# Patient Record
Sex: Female | Born: 1985 | Race: White | Hispanic: No | Marital: Single | Smoking: Never smoker
Health system: Southern US, Community
[De-identification: ages and names within clinical notes are randomized; demographics above are authoritative.]

## PROBLEM LIST (undated history)

## (undated) DIAGNOSIS — J189 Pneumonia, unspecified organism: Secondary | ICD-10-CM

## (undated) DIAGNOSIS — T8859XA Other complications of anesthesia, initial encounter: Secondary | ICD-10-CM

## (undated) DIAGNOSIS — K900 Celiac disease: Secondary | ICD-10-CM

## (undated) DIAGNOSIS — Z22322 Carrier or suspected carrier of Methicillin resistant Staphylococcus aureus: Secondary | ICD-10-CM

## (undated) DIAGNOSIS — F909 Attention-deficit hyperactivity disorder, unspecified type: Secondary | ICD-10-CM

## (undated) DIAGNOSIS — T4145XA Adverse effect of unspecified anesthetic, initial encounter: Secondary | ICD-10-CM

---
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
## 2014-01-18 ENCOUNTER — Encounter (HOSPITAL_COMMUNITY): Payer: Self-pay | Admitting: Emergency Medicine

## 2014-01-18 ENCOUNTER — Inpatient Hospital Stay (HOSPITAL_COMMUNITY)
Admission: EM | Admit: 2014-01-18 | Discharge: 2014-01-23 | DRG: 872 | Disposition: A | Discharge status: Home Health | Payer: Medicaid - Out of State | Attending: Family Medicine | Admitting: Family Medicine

## 2014-01-18 ENCOUNTER — Inpatient Hospital Stay (HOSPITAL_COMMUNITY): Payer: Medicaid - Out of State

## 2014-01-18 DIAGNOSIS — K900 Celiac disease: Secondary | ICD-10-CM | POA: Diagnosis present

## 2014-01-18 DIAGNOSIS — Z823 Family history of stroke: Secondary | ICD-10-CM | POA: Diagnosis not present

## 2014-01-18 DIAGNOSIS — A4102 Sepsis due to Methicillin resistant Staphylococcus aureus: Secondary | ICD-10-CM | POA: Diagnosis present

## 2014-01-18 DIAGNOSIS — L03114 Cellulitis of left upper limb: Secondary | ICD-10-CM

## 2014-01-18 DIAGNOSIS — L0291 Cutaneous abscess, unspecified: Secondary | ICD-10-CM

## 2014-01-18 DIAGNOSIS — N12 Tubulo-interstitial nephritis, not specified as acute or chronic: Secondary | ICD-10-CM | POA: Diagnosis present

## 2014-01-18 DIAGNOSIS — F909 Attention-deficit hyperactivity disorder, unspecified type: Secondary | ICD-10-CM | POA: Diagnosis present

## 2014-01-18 DIAGNOSIS — IMO0002 Reserved for concepts with insufficient information to code with codable children: Secondary | ICD-10-CM | POA: Diagnosis present

## 2014-01-18 DIAGNOSIS — M7989 Other specified soft tissue disorders: Secondary | ICD-10-CM | POA: Diagnosis present

## 2014-01-18 DIAGNOSIS — B379 Candidiasis, unspecified: Secondary | ICD-10-CM | POA: Diagnosis present

## 2014-01-18 DIAGNOSIS — A419 Sepsis, unspecified organism: Secondary | ICD-10-CM | POA: Diagnosis present

## 2014-01-18 DIAGNOSIS — L0390 Cellulitis, unspecified: Secondary | ICD-10-CM | POA: Diagnosis present

## 2014-01-18 DIAGNOSIS — D72829 Elevated white blood cell count, unspecified: Secondary | ICD-10-CM

## 2014-01-18 HISTORY — DX: Other complications of anesthesia, initial encounter: T88.59XA

## 2014-01-18 HISTORY — DX: Attention-deficit hyperactivity disorder, unspecified type: F90.9

## 2014-01-18 HISTORY — DX: Adverse effect of unspecified anesthetic, initial encounter: T41.45XA

## 2014-01-18 HISTORY — DX: Carrier or suspected carrier of methicillin resistant Staphylococcus aureus: Z22.322

## 2014-01-18 HISTORY — DX: Celiac disease: K90.0

## 2014-01-18 LAB — BASIC METABOLIC PANEL
Anion gap: 12 (ref 5–15)
BUN: 9 mg/dL (ref 6–23)
CHLORIDE: 101 meq/L (ref 96–112)
CO2: 27 meq/L (ref 19–32)
Calcium: 9.4 mg/dL (ref 8.4–10.5)
Creatinine, Ser: 0.56 mg/dL (ref 0.50–1.10)
GFR calc non Af Amer: >90 mL/min (ref >90)
Glucose, Bld: 92 mg/dL (ref 70–99)
Potassium: 3.7 mEq/L (ref 3.7–5.3)
Sodium: 140 mEq/L (ref 137–147)

## 2014-01-18 LAB — URINE MICROSCOPIC-ADD ON

## 2014-01-18 LAB — SEDIMENTATION RATE: SED RATE: 19 mm/h (ref 0–22)

## 2014-01-18 LAB — CBC WITH DIFFERENTIAL/PLATELET
BASOS PCT: 0 % (ref 0–1)
Basophils Absolute: 0.1 10*3/uL (ref 0.0–0.1)
Eosinophils Absolute: 0.3 10*3/uL (ref 0.0–0.7)
Eosinophils Relative: 2 % (ref 0–5)
HEMATOCRIT: 35.8 % — AB (ref 36.0–46.0)
Hemoglobin: 11.9 g/dL — ABNORMAL LOW (ref 12.0–15.0)
LYMPHS PCT: 21 % (ref 12–46)
Lymphs Abs: 2.6 10*3/uL (ref 0.7–4.0)
MCH: 31.2 pg (ref 26.0–34.0)
MCHC: 33.2 g/dL (ref 30.0–36.0)
MCV: 94 fL (ref 78.0–100.0)
MONO ABS: 0.6 10*3/uL (ref 0.1–1.0)
Monocytes Relative: 5 % (ref 3–12)
NEUTROS ABS: 9 10*3/uL — AB (ref 1.7–7.7)
NEUTROS PCT: 72 % (ref 43–77)
Platelets: 357 10*3/uL (ref 150–400)
RBC: 3.81 MIL/uL — ABNORMAL LOW (ref 3.87–5.11)
RDW: 12.5 % (ref 11.5–15.5)
WBC: 12.5 10*3/uL — AB (ref 4.0–10.5)

## 2014-01-18 LAB — URINALYSIS, ROUTINE W REFLEX MICROSCOPIC
Bilirubin Urine: NEGATIVE
Glucose, UA: NEGATIVE mg/dL
Hgb urine dipstick: NEGATIVE
Ketones, ur: NEGATIVE mg/dL
NITRITE: NEGATIVE
PH: 6 (ref 5.0–8.0)
Protein, ur: NEGATIVE mg/dL
Urobilinogen, UA: 0.2 mg/dL (ref 0.0–1.0)

## 2014-01-18 LAB — LACTIC ACID, PLASMA: LACTIC ACID, VENOUS: 0.8 mmol/L (ref 0.5–2.2)

## 2014-01-18 MED ORDER — HYDROMORPHONE HCL PF 1 MG/ML IJ SOLN
1.0000 mg | INTRAMUSCULAR | Status: AC | PRN
  Administered 2014-01-18 – 2014-01-19 (×3): 1 mg via INTRAVENOUS
  Filled 2014-01-18 (×3): qty 1

## 2014-01-18 MED ORDER — MORPHINE SULFATE 4 MG/ML IJ SOLN
4.0000 mg | Freq: Once | INTRAMUSCULAR | Status: AC
  Administered 2014-01-18: 4 mg via INTRAVENOUS
  Filled 2014-01-18: qty 1

## 2014-01-18 MED ORDER — ACETAMINOPHEN 325 MG PO TABS
650.0000 mg | ORAL_TABLET | Freq: Four times a day (QID) | ORAL | Status: DC | PRN
  Administered 2014-01-19 – 2014-01-23 (×3): 650 mg via ORAL
  Filled 2014-01-18 (×3): qty 2

## 2014-01-18 MED ORDER — ONDANSETRON HCL 4 MG/2ML IJ SOLN: 4.0000 mg | Freq: Three times a day (TID) | INTRAMUSCULAR | Status: DC | PRN

## 2014-01-18 MED ORDER — ENOXAPARIN SODIUM 40 MG/0.4ML ~~LOC~~ SOLN
40.0000 mg | SUBCUTANEOUS | Status: DC
  Administered 2014-01-18 – 2014-01-19 (×2): 40 mg via SUBCUTANEOUS
  Filled 2014-01-18 (×2): qty 0.4

## 2014-01-18 MED ORDER — AMPHETAMINE-DEXTROAMPHETAMINE 10 MG PO TABS
30.0000 mg | ORAL_TABLET | Freq: Every day | ORAL | Status: DC
  Filled 2014-01-18: qty 3

## 2014-01-18 MED ORDER — DIPHENHYDRAMINE HCL 50 MG/ML IJ SOLN
25.0000 mg | Freq: Two times a day (BID) | INTRAMUSCULAR | Status: DC
  Administered 2014-01-19 – 2014-01-22 (×7): 25 mg via INTRAVENOUS
  Filled 2014-01-18 (×7): qty 1

## 2014-01-18 MED ORDER — ONDANSETRON HCL 4 MG PO TABS
4.0000 mg | ORAL_TABLET | Freq: Four times a day (QID) | ORAL | Status: DC | PRN
  Administered 2014-01-22: 4 mg via ORAL
  Filled 2014-01-18: qty 1

## 2014-01-18 MED ORDER — OXYCODONE-ACETAMINOPHEN 5-325 MG PO TABS
1.0000 | ORAL_TABLET | ORAL | Status: DC | PRN
  Administered 2014-01-18: 1 via ORAL
  Administered 2014-01-19 – 2014-01-22 (×9): 2 via ORAL
  Filled 2014-01-18 (×4): qty 2
  Filled 2014-01-18: qty 1
  Filled 2014-01-18 (×5): qty 2

## 2014-01-18 MED ORDER — ONDANSETRON HCL 4 MG/2ML IJ SOLN
4.0000 mg | Freq: Once | INTRAMUSCULAR | Status: AC
  Administered 2014-01-18: 4 mg via INTRAVENOUS
  Filled 2014-01-18: qty 2

## 2014-01-18 MED ORDER — SODIUM CHLORIDE 0.9 % IV SOLN
INTRAVENOUS | Status: AC
  Administered 2014-01-18: 19:00:00 via INTRAVENOUS

## 2014-01-18 MED ORDER — LIDOCAINE HCL (PF) 2 % IJ SOLN
2.0000 mL | Freq: Once | INTRAMUSCULAR | Status: AC
  Administered 2014-01-18: 2 mL
  Filled 2014-01-18: qty 10

## 2014-01-18 MED ORDER — AMPHETAMINE-DEXTROAMPHETAMINE 10 MG PO TABS
10.0000 mg | ORAL_TABLET | Freq: Every day | ORAL | Status: DC
  Administered 2014-01-19 – 2014-01-22 (×3): 10 mg via ORAL
  Filled 2014-01-18 (×3): qty 1

## 2014-01-18 MED ORDER — VANCOMYCIN HCL IN DEXTROSE 1-5 GM/200ML-% IV SOLN
INTRAVENOUS | Status: AC
  Filled 2014-01-18: qty 400

## 2014-01-18 MED ORDER — CEFTRIAXONE SODIUM 1 G IJ SOLR
INTRAMUSCULAR | Status: AC
  Filled 2014-01-18: qty 10

## 2014-01-18 MED ORDER — DEXTROSE 5 % IV SOLN
1.0000 g | INTRAVENOUS | Status: DC
  Filled 2014-01-18 (×3): qty 10

## 2014-01-18 MED ORDER — VANCOMYCIN HCL IN DEXTROSE 1-5 GM/200ML-% IV SOLN
1000.0000 mg | Freq: Two times a day (BID) | INTRAVENOUS | Status: DC
  Administered 2014-01-19 – 2014-01-22 (×7): 1000 mg via INTRAVENOUS
  Filled 2014-01-18 (×9): qty 200

## 2014-01-18 MED ORDER — ONDANSETRON HCL 4 MG/2ML IJ SOLN
4.0000 mg | Freq: Four times a day (QID) | INTRAMUSCULAR | Status: DC | PRN
Start: 2014-01-18 — End: 2014-01-23
  Administered 2014-01-18 – 2014-01-22 (×10): 4 mg via INTRAVENOUS
  Filled 2014-01-18 (×12): qty 2

## 2014-01-18 MED ORDER — DIPHENHYDRAMINE HCL 50 MG/ML IJ SOLN
25.0000 mg | Freq: Once | INTRAMUSCULAR | Status: AC
  Administered 2014-01-18: 25 mg via INTRAVENOUS
  Filled 2014-01-18: qty 1

## 2014-01-18 MED ORDER — DOCUSATE SODIUM 100 MG PO CAPS
100.0000 mg | ORAL_CAPSULE | Freq: Two times a day (BID) | ORAL | Status: DC
  Administered 2014-01-18 – 2014-01-23 (×4): 100 mg via ORAL
  Filled 2014-01-18 (×9): qty 1

## 2014-01-18 MED ORDER — VANCOMYCIN HCL IN DEXTROSE 1-5 GM/200ML-% IV SOLN
1000.0000 mg | Freq: Once | INTRAVENOUS | Status: AC
  Administered 2014-01-18: 1000 mg via INTRAVENOUS
  Filled 2014-01-18: qty 200

## 2014-01-18 MED ORDER — ACETAMINOPHEN 650 MG RE SUPP: 650.0000 mg | Freq: Four times a day (QID) | RECTAL | Status: DC | PRN

## 2014-01-18 MED ORDER — DEXTROSE 5 % IV SOLN
1.0000 g | INTRAVENOUS | Status: DC
  Administered 2014-01-18 – 2014-01-19 (×2): 1 g via INTRAVENOUS
  Filled 2014-01-18 (×3): qty 10

## 2014-01-18 NOTE — H&P (Signed)
PCP: [facility]   Chief Complaint:  Left elbow swelling HPI: [name] [name] is a 28 y.o. female   has a past medical history of ADHD (attention deficit hyperactivity disorder); MRSA (methicillin resistant staph aureus) culture positive; and Celiac disease.   Presented with  1 week hx of left elbow swelling and pain. Started with a small bump and progressed. Her forearm started to hurt she went to [facility] 2 days ago. And was put on doxycyline but did not improve. She has  Had MRSA in the past. % years ago she had a large thigh abscess. She reports some chills and slightly elevated BP.She is able to move the elbow but it is limited by pain. Some nausea no vomiting. She has had some back pain as well. In ER evidence of UTI and leukocytosis. In ER she had an I &D done and wound culture were sent. Plain films unremarkable no evidence of gas in tissues. Patient noted that she had had generalized painful lymphadenopathy few months ago.  Hospitalist was called for admission for cellulitis and pyelonephritis.   Review of Systems:    Pertinent positives include: chills, left arm swelling, back pain  Constitutional:  No weight loss, night sweats, Fevers,  fatigue, weight loss  HEENT:  No headaches, Difficulty swallowing,Tooth/dental problems,Sore throat,  No sneezing, itching, ear ache, nasal congestion, post nasal drip,  Cardio-vascular:  No chest pain, Orthopnea, PND, anasarca, dizziness, palpitations.no Bilateral lower extremity swelling  GI:  No heartburn, indigestion, abdominal pain, nausea, vomiting, diarrhea, change in bowel habits, loss of appetite, melena, blood in stool, hematemesis Resp:  no shortness of breath at rest. No dyspnea on exertion, No excess mucus, no productive cough, No non-productive cough, No coughing up of blood.No change in color of mucus.No wheezing. Skin:  no rash or lesions. No jaundice GU:  no dysuria, change in color of urine, no urgency or frequency. No  straining to urinate.  No flank pain.  Musculoskeletal:  No joint pain or no joint swelling. No decreased range of motion. No back pain.  Psych:  No change in mood or affect. No depression or anxiety. No memory loss.  Neuro: no localizing neurological complaints, no tingling, no weakness, no double vision, no gait abnormality, no slurred speech, no confusion  Otherwise ROS are negative except for above, 10 systems were reviewed  Past Medical History: Past Medical History  Diagnosis Date  . ADHD (attention deficit hyperactivity disorder)   . MRSA (methicillin resistant staph aureus) culture positive   . Celiac disease    Past Surgical History  Procedure Laterality Date  . Tonsillectomy       Medications: Prior to Admission medications   Medication Sig Start Date End Date Taking? Authorizing Provider  amphetamine-dextroamphetamine (ADDERALL) 10 MG tablet Take 10 mg by mouth daily at 12 noon. In afternoon   Yes Historical Provider, MD  amphetamine-dextroamphetamine (ADDERALL) 30 MG tablet Take 30 mg by mouth daily. In AM   Yes Historical Provider, MD  doxycycline (VIBRA-TABS) 100 MG tablet Take 100 mg by mouth 2 (two) times daily.   Yes Historical Provider, MD  HYDROcodone-acetaminophen (NORCO/VICODIN) 5-325 MG per tablet Take 1 tablet by mouth every 6 (six) hours as needed (pain).   Yes Historical Provider, MD    Allergies:   Allergies  Allergen Reactions  . Latex Hives  . Pineapple Hives    Social History:  Ambulatory  independently   Lives at home With family     reports that she has never  smoked. She does not have any smokeless tobacco history on file. She reports that she does not drink alcohol or use illicit drugs.    Family History: family history includes Cancer - Other in her father; Stroke in her mother.    Physical Exam: Patient Vitals for the past 24 hrs:  BP Temp Temp src Pulse Resp SpO2 Height Weight  01/18/14 1527 - - - 100 19 100 % - -  01/18/14  1349 144/95 mmHg 99 F (37.2 C) Oral 116 18 100 %  (1.6 m) 62.596 kg (138 lb)    1. General:  in No Acute distress 2. Psychological: Alert and   Oriented 3. Head/ENT:   Moist   Mucous Membranes                          Head Non traumatic, neck supple                          Normal   Dentition 4. SKIN: normal   Skin turgor,  Skin clean erythema noted around left elbow, small area of incision.  5. Heart: Regular rate and rhythm no Murmur, Rub or gallop 6. Lungs: Clear to auscultation bilaterally, no wheezes or crackles   7. Abdomen: Soft, non-tender, Non distended 8. Lower extremities: no clubbing, cyanosis, or edema 9. Neurologically Grossly intact, moving all 4 extremities equally 10. MSK: Normal range of motion costovertebral tenderness noted on the right  body mass index is 24.45 kg/(m^2).   Labs on Admission:   Recent Labs  01/18/14 1446  NA 140  K 3.7  CL 101  CO2 27  GLUCOSE 92  BUN 9  CREATININE 0.56  CALCIUM 9.4   No results found for this basename: AST, ALT, ALKPHOS, BILITOT, PROT, ALBUMIN,  in the last 72 hours No results found for this basename: LIPASE, AMYLASE,  in the last 72 hours  Recent Labs  01/18/14 1446  WBC 12.5*  NEUTROABS 9.0*  HGB 11.9*  HCT 35.8*  MCV 94.0  PLT 357   No results found for this basename: CKTOTAL, CKMB, CKMBINDEX, TROPONINI,  in the last 72 hours No results found for this basename: TSH, T4TOTAL, FREET3, T3FREE, THYROIDAB,  in the last 72 hours No results found for this basename: VITAMINB12, FOLATE, FERRITIN, TIBC, IRON, RETICCTPCT,  in the last 72 hours No results found for this basename: HGBA1C    Estimated Creatinine Clearance: 85.8 ml/min (by C-G formula based on Cr of 0.56). ABG No results found for this basename: phart, pco2, po2, hco3, tco2, acidbasedef, o2sat     No results found for this basename: DDIMER     UA evidence of UTI  BNP (last 3 results) No results found for this basename: PROBNP,  in the  last 8760 hours  Filed Weights   01/18/14 1349  Weight: 62.596 kg (138 lb)   Cultures: No results found for this basename: sdes, specrequest, cult, reptstatus     Radiological Exams on Admission: No results found.  Chart has been reviewed  Assessment/Plan 28 year old female with history of celiac disease here with cellulitis of the left elbow and pyelonephritis worrisome for early sepsis  Present on Admission:  . Cellulitis - history of MRSA in the past. Will cover with vancomycin. Plain imaging negative for gas in the soft tissues. Obtain lactic acid and sedimentation rate. No evidence of osteomyelitis. Given her recurrent infections and history of lymphadenopathy and  will  check HIV status Pyelonephritis - treat with IV Rocephin await results of urine culture, obtain renal ultrasound Early sepsis given elevated white blood cell count and heart rate. Will give IV fluids and covered by the antibiotics. Prophylaxis:   Lovenox,  CODE STATUS:  FULL CODE   Other plan as per orders.  I have spent a total of 55 min on this admission  Arran Fessel 01/18/2014, 4:50 PM  [facility]  Pager [phone]   If 7AM-7PM, please contact the day team taking care of the patient  [email or web address]  Password [email or web address]

## 2014-01-18 NOTE — ED Notes (Signed)
Pt states she has a swollen area close to her lt elbow, unsure of how it started, may have started from a spider bite. Area swollen and red with a white center. Placed on Doxycycline on Wednesday, swelling and pain worse.

## 2014-01-18 NOTE — ED Notes (Signed)
Left forearm to left upper arm swollen, red, tender to touch. Warm, sensory intact.

## 2014-01-18 NOTE — ED Notes (Signed)
Report given to [facility] on Dept 300, all questions answered.

## 2014-01-18 NOTE — Progress Notes (Signed)
ANTIBIOTIC CONSULT NOTE - INITIAL  Pharmacy Consult for Vancomycin Indication: cellulitis  Allergies  Allergen Reactions  . Latex Hives  . Pineapple Hives  . Aloe Vera Hives    Can use lotions with Aloe in it but allergic to Aloe plant.     Patient Measurements: Height:  (160 cm) Weight: 138 lb (62.596 kg) IBW/kg (Calculated) : 52.4  Vital Signs: Temp: 98.7 F (37.1 C) (09/11 1828) Temp src: Oral (09/11 1828) BP: 124/90 mmHg (09/11 1828) Pulse Rate: 91 (09/11 1828) Intake/Output from previous day:   Intake/Output from this shift:    Labs:  Recent Labs  01/18/14 1446  WBC 12.5*  HGB 11.9*  PLT 357  CREATININE 0.56   Estimated Creatinine Clearance: 85.8 ml/min (by C-G formula based on Cr of 0.56). No results found for this basename: VANCOTROUGH, VANCOPEAK, VANCORANDOM, GENTTROUGH, GENTPEAK, GENTRANDOM, TOBRATROUGH, TOBRAPEAK, TOBRARND, AMIKACINPEAK, AMIKACINTROU, AMIKACIN,  in the last 72 hours   Microbiology: No results found for this or any previous visit (from the past 720 hour(s)).  Medical History: Past Medical History  Diagnosis Date  . ADHD (attention deficit hyperactivity disorder)   . MRSA (methicillin resistant staph aureus) culture positive   . Celiac disease    Anti-infectives   Start     Dose/Rate Route Frequency Ordered Stop   01/19/14 0300  vancomycin (VANCOCIN) IVPB 1000 mg/200 mL premix     1,000 mg 200 mL/hr over 60 Minutes Intravenous Every 12 hours 01/18/14 1839     01/18/14 1830  cefTRIAXone (ROCEPHIN) 1 g in dextrose 5 % 50 mL IVPB     1 g 100 mL/hr over 30 Minutes Intravenous Every 24 hours 01/18/14 1813     01/18/14 1500  vancomycin (VANCOCIN) IVPB 1000 mg/200 mL premix     1,000 mg 200 mL/hr over 60 Minutes Intravenous  Once 01/18/14 1445 01/18/14 1646     Assessment: Good renal function  Goal of Therapy:  Trough level 12-18  Plan:  Vancomycin 1GM IV q12hrs Check trough at steady [address] [place]  A 01/18/2014,6:44 PM

## 2014-01-18 NOTE — ED Notes (Signed)
Patient moved to room 20, for spo2 and cardiac monitoring

## 2014-01-18 NOTE — ED Provider Notes (Signed)
CSN: [id number]     Arrival date & time 01/18/14  1346 History   First MD Initiated Contact with Patient 01/18/14 1413     Chief Complaint  Patient presents with  . Abscess     (Consider location/radiation/quality/duration/timing/severity/associated sxs/prior Treatment) HPI  [name] [name] is a 28 y.o. female with distant history of mrsa,  Presenting with a 5 day history of increasing pain,  Swelling and redness of her left forearm, started as a small pimple at her left posterior elbow.  She was seen at an outside hospital 2 days ago and placed on doxycycline.  Her pain is worsened, she has spreading redness and swelling despite antibiotics and warm compresses.  She has has had 4 doses of doxycycline, last dose taken this morning.  She denies documented fevers, but has felt warm with intermittent chills.  She has had no nausea, vomiting or other complaint.    Past Medical History  Diagnosis Date  . ADHD (attention deficit hyperactivity disorder)   . MRSA (methicillin resistant staph aureus) culture positive   . Celiac disease    Past Surgical History  Procedure Laterality Date  . Tonsillectomy     History reviewed. No pertinent family history. History  Substance Use Topics  . Smoking status: Never Smoker   . Smokeless tobacco: Not on file  . Alcohol Use: No   OB History   Grav Para Term Preterm Abortions TAB SAB Ect Mult Living                 Review of Systems  Constitutional: Positive for chills. Negative for fever.  HENT: Negative.   Eyes: Negative.   Respiratory: Negative.   Cardiovascular: Negative.   Gastrointestinal: Negative.  Negative for nausea and vomiting.  Genitourinary: Negative.   Musculoskeletal: Positive for arthralgias. Negative for joint swelling and myalgias.  Skin: Positive for color change and wound.  Neurological: Negative for weakness and numbness.      Allergies  Latex and Pineapple  Home Medications   Prior to Admission medications    Medication Sig Start Date End Date Taking? Authorizing Provider  amphetamine-dextroamphetamine (ADDERALL) 10 MG tablet Take 10 mg by mouth daily at 12 noon.   Yes Historical Provider, MD  amphetamine-dextroamphetamine (ADDERALL) 30 MG tablet Take 30 mg by mouth daily.   Yes Historical Provider, MD  doxycycline (VIBRA-TABS) 100 MG tablet Take 100 mg by mouth 2 (two) times daily.   Yes Historical Provider, MD  HYDROcodone-acetaminophen (NORCO/VICODIN) 5-325 MG per tablet Take 1 tablet by mouth every 6 (six) hours as needed (pain).   Yes Historical Provider, MD   BP 144/95  Pulse 100  Temp(Src) 99 F (37.2 C) (Oral)  Resp 19  Ht  (1.6 m)  Wt 138 lb (62.596 kg)  BMI 24.45 kg/m2  SpO2 100%  LMP 01/04/2014 Physical Exam  Nursing note and vitals reviewed. Constitutional: She appears well-developed and well-nourished.  HENT:  Head: Normocephalic and atraumatic.  Eyes: Conjunctivae are normal.  Neck: Neck supple.  Cardiovascular: Regular rhythm, normal heart sounds and intact distal pulses.   tachycardic  Pulmonary/Chest: Effort normal and breath sounds normal.  Musculoskeletal: She exhibits edema and tenderness.  Neurological: She is alert.  Skin: Skin is warm and dry. There is erythema.  Small pustule posterior left upper forearm.  Cellulitis and edema of forearm to just above wrist, mostly dorsal.  Erythema without edema proximal to volar mid upper arm.  No red streaking, no axillary adenopathy.   Psychiatric: She has  a normal mood and affect.    ED Course  Procedures (including critical care time)   INCISION AND DRAINAGE Performed by: Breven Guidroz ConsBurgess Amorbal consent obtained. Risks and benefits: risks, benefits and alternatives were discussed Type: abscess  Body area: left elbow  Anesthesia: local infiltration  Incision was made with a scalpel.  Local anesthetic: lidocaine 2% without epinephrine  Anesthetic total: 1 ml  Complexity: complex Blunt dissection  to break up loculations  Drainage: purulent  Drainage amount: trace  Packing material: na. Small pustule only. Patient tolerance: Patient tolerated the procedure well with no immediate complications.    Labs Review Labs Reviewed  CBC WITH DIFFERENTIAL - Abnormal; Notable for the following:    WBC 12.5 (*)    RBC 3.81 (*)    Hemoglobin 11.9 (*)    HCT 35.8 (*)    Neutro Abs 9.0 (*)    All other components within normal limits  URINALYSIS, ROUTINE W REFLEX MICROSCOPIC - Abnormal; Notable for the following:    Specific Gravity, Urine <1.005 (*)    Leukocytes, UA LARGE (*)    All other components within normal limits  URINE MICROSCOPIC-ADD ON - Abnormal; Notable for the following:    Squamous Epithelial / LPF MANY (*)    Bacteria, UA MANY (*)    All other components within normal limits  CULTURE, ROUTINE-ABSCESS  BASIC METABOLIC PANEL  POC URINE PREG, ED    Imaging Review No results found.   EKG Interpretation None      MDM   Final diagnoses:  Cellulitis of left upper extremity    Pt was seen by Dr Estell Harpin who agrees with admission.  Labs reviewed.  Pt started on IV vancomycin.   Pustule opened,  Culture collected.  Spoke with Dr Adela Glimpse with [facility] - agrees with admission.  Temp admit orders placed.    Burgess Amor, PA-C 01/18/14 1640

## 2014-01-18 NOTE — ED Notes (Signed)
About 5 mins after starting Vancomycin patient became very red in face and trunk, patient complaining of severe itching. Vancomycin stopped and Burgess Amor informed.

## 2014-01-19 ENCOUNTER — Inpatient Hospital Stay (HOSPITAL_COMMUNITY): Payer: Medicaid - Out of State

## 2014-01-19 DIAGNOSIS — D72829 Elevated white blood cell count, unspecified: Secondary | ICD-10-CM

## 2014-01-19 DIAGNOSIS — A419 Sepsis, unspecified organism: Secondary | ICD-10-CM

## 2014-01-19 LAB — CBC
HCT: 35.3 % — ABNORMAL LOW (ref 36.0–46.0)
Hemoglobin: 11.4 g/dL — ABNORMAL LOW (ref 12.0–15.0)
MCH: 31.1 pg (ref 26.0–34.0)
MCHC: 32.3 g/dL (ref 30.0–36.0)
MCV: 96.2 fL (ref 78.0–100.0)
Platelets: 288 10*3/uL (ref 150–400)
RBC: 3.67 MIL/uL — AB (ref 3.87–5.11)
RDW: 12.8 % (ref 11.5–15.5)
WBC: 10.5 10*3/uL (ref 4.0–10.5)

## 2014-01-19 LAB — COMPREHENSIVE METABOLIC PANEL
ALT: 10 U/L (ref 0–35)
AST: 12 U/L (ref 0–37)
Albumin: 3.3 g/dL — ABNORMAL LOW (ref 3.5–5.2)
Alkaline Phosphatase: 46 U/L (ref 39–117)
Anion gap: 12 (ref 5–15)
BUN: 11 mg/dL (ref 6–23)
CALCIUM: 8.5 mg/dL (ref 8.4–10.5)
CHLORIDE: 100 meq/L (ref 96–112)
CO2: 26 meq/L (ref 19–32)
Creatinine, Ser: 0.56 mg/dL (ref 0.50–1.10)
GLUCOSE: 149 mg/dL — AB (ref 70–99)
Potassium: 3.6 mEq/L — ABNORMAL LOW (ref 3.7–5.3)
SODIUM: 138 meq/L (ref 137–147)
Total Bilirubin: 0.2 mg/dL — ABNORMAL LOW (ref 0.3–1.2)
Total Protein: 6.1 g/dL (ref 6.0–8.3)

## 2014-01-19 LAB — HIV ANTIBODY (ROUTINE TESTING W REFLEX): HIV 1&2 Ab, 4th Generation: NONREACTIVE

## 2014-01-19 LAB — MAGNESIUM: Magnesium: 2 mg/dL (ref 1.5–2.5)

## 2014-01-19 LAB — PHOSPHORUS: Phosphorus: 3.8 mg/dL (ref 2.3–4.6)

## 2014-01-19 LAB — MRSA PCR SCREENING: MRSA by PCR: NEGATIVE

## 2014-01-19 MED ORDER — AMPHETAMINE-DEXTROAMPHETAMINE 10 MG PO TABS
30.0000 mg | ORAL_TABLET | Freq: Every day | ORAL | Status: DC
  Administered 2014-01-19 – 2014-01-23 (×5): 30 mg via ORAL
  Filled 2014-01-19 (×5): qty 3

## 2014-01-19 MED ORDER — TEMAZEPAM 15 MG PO CAPS
15.0000 mg | ORAL_CAPSULE | Freq: Every evening | ORAL | Status: DC | PRN
  Administered 2014-01-19 – 2014-01-22 (×4): 15 mg via ORAL
  Filled 2014-01-19 (×4): qty 1

## 2014-01-19 MED ORDER — MORPHINE SULFATE 2 MG/ML IJ SOLN
2.0000 mg | INTRAMUSCULAR | Status: DC | PRN
  Administered 2014-01-19 – 2014-01-21 (×9): 2 mg via INTRAVENOUS
  Filled 2014-01-19 (×9): qty 1

## 2014-01-19 MED ORDER — SODIUM CHLORIDE 0.9 % IV SOLN
INTRAVENOUS | Status: AC
  Administered 2014-01-19: 12:00:00 via INTRAVENOUS

## 2014-01-19 MED ORDER — BACITRACIN ZINC 500 UNIT/GM EX OINT
TOPICAL_OINTMENT | Freq: Two times a day (BID) | CUTANEOUS | Status: DC
  Administered 2014-01-19 – 2014-01-23 (×7): 1 via TOPICAL
  Filled 2014-01-19 (×8): qty 0.9

## 2014-01-19 NOTE — Progress Notes (Signed)
Pt. Complaining of 8/10 pain that is unrelieved by Percocet.  Notified MD. Will continue to monitor patient and follow MD's orders.

## 2014-01-19 NOTE — Progress Notes (Signed)
Utilization review Completed Camira Geidel RN BSN   

## 2014-01-19 NOTE — ED Provider Notes (Addendum)
Medical screening examination/treatment/procedure(s) were conducted as a shared visit with non-physician practitioner(s) and myself.  I personally evaluated the patient during the encounter.   EKG Interpretation None      Pt complains of pain  And swelling left forearm.  pe left forearm with abscess,  Tender and swollen and inflamed  Benny Lennert, MD 01/19/14 0022  Benny Lennert, MD 01/28/14 [phone]

## 2014-01-19 NOTE — Progress Notes (Signed)
[facility] PROGRESS NOTE  [name] [name] ONG:[id number] DOB: 12-02-1985 DOA: 01/18/2014 PCP: PROVIDER NOT IN SYSTEM  Assessment/Plan: 1-SIRS/early sepsis due to left elbow cellulitis: -continue vancomycin -start cold compresses and arm elevation -erythema is improving; but still swollen and with pain -WBC's trending down and no fever. -if failed to improved further will get MRI to assess tissue extension and discussed with ortho for deeper I&D  2-hx of MRSA infection: patient broken skin in her back, from what appears to be 2 pimples will apply bacitracin and vanc will cover. Will also check for MRSA screening again; if positive will follow decolonization protocol  3-UTI continue rocephin. Follow urine cultures. No fever, WBC's trending down. Positive CVA on exam  4-ADHD: continue adderall  5-leukocytosis: due to #1. Improving -continue IV antibiotics.   Code Status: Full Family Communication: mother at bedside  Disposition Plan: home when infection/symptoms controlled   Consultants:  None   Procedures:  Left elbow bedside I&D (9/11)  Antibiotics:  Vanc  Rocephin    HPI/Subjective: Afebrile, denies nausea or vomiting. Feeling slightly better; but still with pain in her left elbow and with some swelling/erythematous changes. Positive CVA; no dysuria.  Objective: Filed Vitals:   01/19/14 0932  BP: 102/54  Pulse: 84  Temp:   Resp:     Intake/Output Summary (Last 24 hours) at 01/19/14 1127 Last data filed at 01/19/14 0939  Gross per 24 hour  Intake   1630 ml  Output   1300 ml  Net    330 ml   Filed Weights   01/18/14 1349 01/18/14 1828  Weight: 62.596 kg (138 lb) 63.549 kg (140 lb 1.6 oz)    Exam:   General:  Afebrile, feeling better; still with pain in her left elbow  Cardiovascular: S1 and s2, no rubs or gallops  Respiratory: CTA bilaterally  Abdomen: soft, NT, ND, positive BS; positive CVA  Musculoskeletal: left elbow swollen, with  erythema and mild induration feeling on exam; slight sanguineous suppuration/drainage)  Data Reviewed: Basic Metabolic Panel:  Recent Labs Lab 01/18/14 1446 01/19/14 0538  NA 140 138  K 3.7 3.6*  CL 101 100  CO2 27 26  GLUCOSE 92 149*  BUN 9 11  CREATININE 0.56 0.56  CALCIUM 9.4 8.5  MG  --  2.0  PHOS  --  3.8   Liver Function Tests:  Recent Labs Lab 01/19/14 0538  AST 12  ALT 10  ALKPHOS 46  BILITOT 0.2*  PROT 6.1  ALBUMIN 3.3*   CBC:  Recent Labs Lab 01/18/14 1446 01/19/14 0538  WBC 12.5* 10.5  NEUTROABS 9.0*  --   HGB 11.9* 11.4*  HCT 35.8* 35.3*  MCV 94.0 96.2  PLT 357 288    Recent Results (from the past 240 hour(s))  CULTURE, ROUTINE-ABSCESS     Status: None   Collection Time    01/18/14  4:16 PM      Result Value Ref Range Status   Specimen Description OTHER   Final   Special Requests Normal   Final   Gram Stain     Final   Value: NO WBC SEEN     NO SQUAMOUS EPITHELIAL CELLS SEEN     NO ORGANISMS SEEN     Performed at [facility]   Culture     Final   Value: NO GROWTH     Performed at [facility]   Report Status PENDING   Incomplete     Studies: Dg Elbow Complete Left  01/18/2014   CLINICAL DATA:  Pain and wound overlying the olecranon  EXAM: LEFT ELBOW - COMPLETE 3+ VIEW  COMPARISON:  Concurrently obtained radiographs of the forearm  FINDINGS: There is no evidence of fracture, dislocation, or joint effusion. There is no evidence of arthropathy or other focal bone abnormality. Soft tissues are unremarkable.  IMPRESSION: Negative.   Electronically Signed   By: Malachy Moan M.D.   On: 01/18/2014 17:32   Dg Forearm Left  01/18/2014   CLINICAL DATA:  Pain and swelling distal to the olecranon  EXAM: LEFT FOREARM - 2 VIEW  COMPARISON:  Concurrently obtained radiographs of the elbow  FINDINGS: No acute fracture or malalignment. Normal bony mineralization without lytic or blastic osseous lesion. No subcutaneous emphysema or  retained radiopaque foreign body. There is soft tissue swelling over the dorsal aspect of the proximal and mid forearm.  IMPRESSION: Focal soft tissue swelling over the dorsal aspect of the proximal and mid forearm without evidence of retained radiopaque foreign body, subcutaneous emphysema or acute osseous abnormality.   Electronically Signed   By: Malachy Moan M.D.   On: 01/18/2014 17:33    Scheduled Meds: . amphetamine-dextroamphetamine  10 mg Oral Q1200  . amphetamine-dextroamphetamine  30 mg Oral Q breakfast  . cefTRIAXone (ROCEPHIN) IVPB 1 gram/50 mL D5W  1 g Intravenous Q24H  . diphenhydrAMINE  25 mg Intravenous Q12H  . docusate sodium  100 mg Oral BID  . enoxaparin (LOVENOX) injection  40 mg Subcutaneous Q24H  . vancomycin  1,000 mg Intravenous Q12H   Continuous Infusions: . sodium chloride      Active Problems:   Cellulitis   Sepsis    Time spent: >30 minutes    Vassie Loll  [facility] Pager [phone]. If 7PM-7AM, please contact night-coverage at [email or web address], password [email or web address] 01/19/2014, 11:27 AM  LOS: 1 day

## 2014-01-20 ENCOUNTER — Encounter (HOSPITAL_COMMUNITY): Payer: Self-pay | Admitting: Radiology

## 2014-01-20 ENCOUNTER — Inpatient Hospital Stay (HOSPITAL_COMMUNITY): Payer: Medicaid - Out of State

## 2014-01-20 LAB — URINE CULTURE
Colony Count: NO GROWTH
Culture: NO GROWTH
Special Requests: NORMAL

## 2014-01-20 MED ORDER — PIPERACILLIN-TAZOBACTAM 3.375 G IVPB
3.3750 g | Freq: Three times a day (TID) | INTRAVENOUS | Status: DC
  Administered 2014-01-20 – 2014-01-22 (×6): 3.375 g via INTRAVENOUS
  Filled 2014-01-20 (×9): qty 50

## 2014-01-20 MED ORDER — ONDANSETRON HCL 4 MG/2ML IJ SOLN
4.0000 mg | Freq: Once | INTRAMUSCULAR | Status: AC
  Administered 2014-01-20: 4 mg via INTRAVENOUS

## 2014-01-20 MED ORDER — CHLORHEXIDINE GLUCONATE 4 % EX LIQD
60.0000 mL | Freq: Once | CUTANEOUS | Status: AC
  Administered 2014-01-20: 4 via TOPICAL
  Filled 2014-01-20: qty 15

## 2014-01-20 MED ORDER — SODIUM CHLORIDE 0.9 % IV SOLN
INTRAVENOUS | Status: AC
  Administered 2014-01-20: 16:00:00 via INTRAVENOUS

## 2014-01-20 NOTE — Progress Notes (Signed)
ANTIBIOTIC CONSULT NOTE -   Pharmacy Consult for Vancomycin and Zosyn Indication: cellulitis  Allergies  Allergen Reactions  . Latex Hives  . Pineapple Hives  . Aloe Vera Hives    Can use lotions with Aloe in it but allergic to Aloe plant.    Patient Measurements: Height:  (160 cm) Weight: 140 lb 1.6 oz (63.549 kg) IBW/kg (Calculated) : 52.4  Vital Signs: Temp: 98 F (36.7 C) (09/13 0609) Temp src: Oral (09/13 0609) BP: 97/52 mmHg (09/13 0609) Pulse Rate: 76 (09/13 0609) Intake/Output from previous day: 09/12 0701 - 09/13 0700 In: 1838.8 [P.O.:1320; I.V.:318.8; IV Piggyback:200] Out: 4450 [Urine:4450] Intake/Output from this shift: Total I/O In: 120 [P.O.:120] Out: 1700 [Urine:1700]  Labs:  Recent Labs  01/18/14 1446 01/19/14 0538  WBC 12.5* 10.5  HGB 11.9* 11.4*  PLT 357 288  CREATININE 0.56 0.56   Estimated Creatinine Clearance: 93 ml/min (by C-G formula based on Cr of 0.56). No results found for this basename: VANCOTROUGH, Leodis Binet, VANCORANDOM, GENTTROUGH, GENTPEAK, GENTRANDOM, TOBRATROUGH, TOBRAPEAK, TOBRARND, AMIKACINPEAK, AMIKACINTROU, AMIKACIN,  in the last 72 hours   Microbiology: Recent Results (from the past 720 hour(s))  CULTURE, ROUTINE-ABSCESS     Status: None   Collection Time    01/18/14  4:16 PM      Result Value Ref Range Status   Specimen Description OTHER   Final   Special Requests Normal   Final   Gram Stain     Final   Value: NO WBC SEEN     NO SQUAMOUS EPITHELIAL CELLS SEEN     NO ORGANISMS SEEN     Performed at [facility]   Culture     Final   Value: FEW STAPHYLOCOCCUS AUREUS     Note: RIFAMPIN AND GENTAMICIN SHOULD NOT BE USED AS SINGLE DRUGS FOR TREATMENT OF STAPH INFECTIONS.     Performed at [facility]   Report Status PENDING   Incomplete  MRSA PCR SCREENING     Status: None   Collection Time    01/19/14 12:53 PM      Result Value Ref Range Status   MRSA by PCR NEGATIVE  NEGATIVE Final   Comment:             The GeneXpert MRSA Assay (FDA     approved for NASAL specimens     only), is one component of a     comprehensive MRSA colonization     surveillance program. It is not     intended to diagnose MRSA     infection nor to guide or     monitor treatment for     MRSA infections.   Medical History: Past Medical History  Diagnosis Date  . ADHD (attention deficit hyperactivity disorder)   . MRSA (methicillin resistant staph aureus) culture positive   . Celiac disease    Anti-infectives   Start     Dose/Rate Route Frequency Ordered Stop   01/20/14 1500  piperacillin-tazobactam (ZOSYN) IVPB 3.375 g     3.375 g 12.5 mL/hr over 240 Minutes Intravenous Every 8 hours 01/20/14 1331     01/19/14 0300  vancomycin (VANCOCIN) IVPB 1000 mg/200 mL premix     1,000 mg 200 mL/hr over 60 Minutes Intravenous Every 12 hours 01/18/14 1839     01/18/14 2000  cefTRIAXone (ROCEPHIN) 1 g in dextrose 5 % 50 mL IVPB  Status:  Discontinued     1 g 100 mL/hr over 30 Minutes Intravenous Every 24  hours 01/18/14 1856 01/20/14 1329   01/18/14 1830  cefTRIAXone (ROCEPHIN) 1 g in dextrose 5 % 50 mL IVPB  Status:  Discontinued     1 g 100 mL/hr over 30 Minutes Intravenous Every 24 hours 01/18/14 1813 01/18/14 1856   01/18/14 1500  vancomycin (VANCOCIN) IVPB 1000 mg/200 mL premix     1,000 mg 200 mL/hr over 60 Minutes Intravenous  Once 01/18/14 1445 01/18/14 1646     Assessment: 28yo female admitted with cellulitis.  Pt has good renal fxn.  MD to broaden coverage with Zosyn.  Goal of Therapy:  Trough level 10-15 Eradicate infection.  Plan:  Vancomycin 1GM IV q12hrs Check trough at steady state Add Zosyn 3.375gm IV q8hrs, each dose over 4 hrs Monitor labs, renal fxn, and cultures  Valrie Hart A 01/20/2014,1:31 PM

## 2014-01-20 NOTE — Progress Notes (Signed)
[facility] PROGRESS NOTE  [name] [name] ZOX:[id number] DOB: [birth year] DOA: 01/18/2014 PCP: PROVIDER NOT IN SYSTEM  Assessment/Plan: 1-SIRS/early sepsis due to left elbow cellulitis/abscess: -continue vancomycin and broaden rocephin to zosyn -continue cold compresses and arm elevation -erythema is improving; but still significantly swollen, with palpable induration and with pain -WBC's trending down and no further fever. -due to ongoing pain and induration MRI has been done and demonstrated abscess w/o osteomyelitis. -Dr. Romeo Apple consulted for I&D  2-hx of MRSA infection: patient broken skin in her back, from what appears to be 2 pimples will continue bacitracin and vanc will cover. Will also check for MRSA screening negative.  3-UTI: cultures pending. Zosyn will cover infection. Improved CVA tenderness on exam. No fever.  4-ADHD: continue adderall  5-leukocytosis: due to #1. Improving -continue IV antibiotics. -CBC in am   Code Status: Full Family Communication: mother at bedside  Disposition Plan: home when infection/symptoms controlled   Consultants:  Ortho (Dr. Romeo Apple)  Procedures:  Left elbow bedside I&D (9/11)  Antibiotics:  Vanc  Rocephin    HPI/Subjective: Afebrile, denies nausea or vomiting. Left elbow continue to be swollen, tender and with decrease range of motion.   Objective: Filed Vitals:   01/20/14 0609  BP: 97/52  Pulse: 76  Temp: 98 F (36.7 C)  Resp: 20    Intake/Output Summary (Last 24 hours) at 01/20/14 1323 Last data filed at 01/20/14 1300  Gross per 24 hour  Intake 1598.75 ml  Output   5600 ml  Net -4001.25 ml   Filed Weights   01/18/14 1349 01/18/14 1828  Weight: 62.596 kg (138 lb) 63.549 kg (140 lb 1.6 oz)    Exam:   General:  Afebrile, complaining of left elbow pain, swelling and decrease range of motion.  Cardiovascular: S1 and s2, no rubs or gallops  Respiratory: CTA bilaterally  Abdomen: soft, NT, ND,  positive BS; positive CVA  Musculoskeletal: left elbow swollen, with organized erythema and induration felt on exam; slight sanguineous/purulent suppuration/drainage)  Data Reviewed: Basic Metabolic Panel:  Recent Labs Lab 01/18/14 1446 01/19/14 0538  NA 140 138  K 3.7 3.6*  CL 101 100  CO2 27 26  GLUCOSE 92 149*  BUN 9 11  CREATININE 0.56 0.56  CALCIUM 9.4 8.5  MG  --  2.0  PHOS  --  3.8   Liver Function Tests:  Recent Labs Lab 01/19/14 0538  AST 12  ALT 10  ALKPHOS 46  BILITOT 0.2*  PROT 6.1  ALBUMIN 3.3*   CBC:  Recent Labs Lab 01/18/14 1446 01/19/14 0538  WBC 12.5* 10.5  NEUTROABS 9.0*  --   HGB 11.9* 11.4*  HCT 35.8* 35.3*  MCV 94.0 96.2  PLT 357 288    Recent Results (from the past 240 hour(s))  CULTURE, ROUTINE-ABSCESS     Status: None   Collection Time    01/18/14  4:16 PM      Result Value Ref Range Status   Specimen Description OTHER   Final   Special Requests Normal   Final   Gram Stain     Final   Value: NO WBC SEEN     NO SQUAMOUS EPITHELIAL CELLS SEEN     NO ORGANISMS SEEN     Performed at [facility]   Culture     Final   Value: FEW STAPHYLOCOCCUS AUREUS     Note: RIFAMPIN AND GENTAMICIN SHOULD NOT BE USED AS SINGLE DRUGS FOR TREATMENT OF STAPH INFECTIONS.  Performed at [facility]   Report Status PENDING   Incomplete  MRSA PCR SCREENING     Status: None   Collection Time    01/19/14 12:53 PM      Result Value Ref Range Status   MRSA by PCR NEGATIVE  NEGATIVE Final   Comment:            The GeneXpert MRSA Assay (FDA     approved for NASAL specimens     only), is one component of a     comprehensive MRSA colonization     surveillance program. It is not     intended to diagnose MRSA     infection nor to guide or     monitor treatment for     MRSA infections.     Studies: Dg Elbow Complete Left  2014-01-19   CLINICAL DATA:  Pain and wound overlying the olecranon  EXAM: LEFT ELBOW - COMPLETE 3+ VIEW   COMPARISON:  Concurrently obtained radiographs of the forearm  FINDINGS: There is no evidence of fracture, dislocation, or joint effusion. There is no evidence of arthropathy or other focal bone abnormality. Soft tissues are unremarkable.  IMPRESSION: Negative.   Electronically Signed   By: Malachy Moan M.D.   On: 01/19/14 17:32   Dg Forearm Left  2014/01/19   CLINICAL DATA:  Pain and swelling distal to the olecranon  EXAM: LEFT FOREARM - 2 VIEW  COMPARISON:  Concurrently obtained radiographs of the elbow  FINDINGS: No acute fracture or malalignment. Normal bony mineralization without lytic or blastic osseous lesion. No subcutaneous emphysema or retained radiopaque foreign body. There is soft tissue swelling over the dorsal aspect of the proximal and mid forearm.  IMPRESSION: Focal soft tissue swelling over the dorsal aspect of the proximal and mid forearm without evidence of retained radiopaque foreign body, subcutaneous emphysema or acute osseous abnormality.   Electronically Signed   By: Malachy Moan M.D.   On: 19-Jan-2014 17:33   US Renal  01/19/2014   CLINICAL DATA:  28 year old female with pyelonephritis.  EXAM: RENAL/URINARY TRACT ULTRASOUND COMPLETE  COMPARISON:  None.  FINDINGS: Right Kidney:  Length: 11.3 cm. Echogenicity within normal limits. No mass, collection or hydronephrosis visualized.  Left Kidney:  Length: 11.7 cm. Echogenicity within normal limits. No mass, collection or hydronephrosis visualized.  Bladder:  Appears normal for degree of distention. Normal bilateral ureteral jets are present.  IMPRESSION: Normal renal ultrasound.   Electronically Signed   By: Laveda Abbe M.D.   On: 01/19/2014 11:45   Mr Elbow Left Wo Contrast  01/20/2014   CLINICAL DATA:  Left elbow cellulitis. Pain and swelling. History of MRSA.  EXAM: MRI OF THE LEFT ELBOW WITHOUT CONTRAST  TECHNIQUE: Multiplanar, multisequence MR imaging of the elbow was performed. No intravenous contrast was administered.   COMPARISON:  None.  FINDINGS: There is a focal 18 x 8 x 5 mm subcutaneous abscess deep to the draining wound. The abscess extends medially from the wound.  TENDONS  Common forearm flexor origin: Normal.  Common forearm extensor origin: Normal.  Biceps: Normal.  Triceps: Normal.  LIGAMENTS  Medial stabilizers: Normal.  Lateral stabilizers:  Normal.  Cartilage: Normal.  Joint: Normal.  No joint effusion.  Cubital tunnel: Normal.  Bones: Normal.  IMPRESSION: Subcutaneous abscess and adjacent cellulitis along the dorsal aspect of the elbow. No extension into the muscles or tendons or joint. No osteomyelitis.   Electronically Signed   By: Violeta Gelinas.D.  On: 01/20/2014 12:47    Scheduled Meds: . amphetamine-dextroamphetamine  10 mg Oral Q1200  . amphetamine-dextroamphetamine  30 mg Oral Q breakfast  . bacitracin   Topical BID  . cefTRIAXone (ROCEPHIN) IVPB 1 gram/50 mL D5W  1 g Intravenous Q24H  . diphenhydrAMINE  25 mg Intravenous Q12H  . docusate sodium  100 mg Oral BID  . enoxaparin (LOVENOX) injection  40 mg Subcutaneous Q24H  . vancomycin  1,000 mg Intravenous Q12H   Continuous Infusions:    Active Problems:   Cellulitis   Sepsis    Time spent: >30 minutes    Vassie Loll  [facility] Pager [phone]. If 7PM-7AM, please contact night-coverage at [email or web address], password [facility] 01/20/2014, 1:23 PM  LOS: 2 days

## 2014-01-20 NOTE — Consult Note (Signed)
Reason for Consult:left elbow pain Referring Physician: hopitalist  [name] [name] is an 28 y.o. female.  Chief Complaint:   Left elbow swelling HPI: [name] [name] is a 28 y.o. female    has a past medical history of ADHD (attention deficit hyperactivity disorder); MRSA (methicillin resistant staph aureus) culture positive; and Celiac disease.    Presented with  1 week hx of left elbow swelling and pain. Started with a small bump and progressed. Her forearm started to hurt she went to [place] 2 days ago. And was put on doxycyline but did not improve. She has  Had MRSA in the past. % years ago she had a large thigh abscess. She reports some chills and slightly elevated BP.She is able to move the elbow but it is limited by pain. Some nausea no vomiting. She has had some back pain as well. In ER evidence of UTI and leukocytosis. In ER she had an I &D done and wound culture were sent. Plain films unremarkable no evidence of gas in tissues. Patient noted that she had had generalized painful lymphadenopathy few months ago.  Hospitalist was called for admission for cellulitis and pyelonephritis.     Past Medical History  Diagnosis Date  . ADHD (attention deficit hyperactivity disorder)   . MRSA (methicillin resistant staph aureus) culture positive   . Celiac disease     Past Surgical History  Procedure Laterality Date  . Tonsillectomy      Family History  Problem Relation Age of Onset  . Stroke Mother   . Cancer - Other Father     Social History:  reports that she has never smoked. She does not have any smokeless tobacco history on file. She reports that she does not drink alcohol or use illicit drugs.  Allergies:  Allergies  Allergen Reactions  . Latex Hives  . Pineapple Hives  . Aloe Vera Hives    Can use lotions with Aloe in it but allergic to Aloe plant.     Medications: I have reviewed the patient's current medications.  Results for orders placed during the hospital  encounter of 01/18/14 (from the past 48 hour(s))  CULTURE, ROUTINE-ABSCESS     Status: None   Collection Time    01/18/14  4:16 PM      Result Value Ref Range   Specimen Description OTHER     Special Requests Normal     Gram Stain       Value: NO WBC SEEN     NO SQUAMOUS EPITHELIAL CELLS SEEN     NO ORGANISMS SEEN     Performed at [facility]   Culture       Value: FEW STAPHYLOCOCCUS AUREUS     Note: RIFAMPIN AND GENTAMICIN SHOULD NOT BE USED AS SINGLE DRUGS FOR TREATMENT OF STAPH INFECTIONS.     Performed at [facility]   Report Status PENDING    LACTIC ACID, PLASMA     Status: None   Collection Time    01/18/14  5:35 PM      Result Value Ref Range   Lactic Acid, Venous 0.8  0.5 - 2.2 mmol/L  SEDIMENTATION RATE     Status: None   Collection Time    01/18/14  5:35 PM      Result Value Ref Range   Sed Rate 19  0 - 22 mm/hr  MAGNESIUM     Status: None   Collection Time    01/19/14  5:38 AM  Result Value Ref Range   Magnesium 2.0  1.5 - 2.5 mg/dL  PHOSPHORUS     Status: None   Collection Time    01/19/14  5:38 AM      Result Value Ref Range   Phosphorus 3.8  2.3 - 4.6 mg/dL  COMPREHENSIVE METABOLIC PANEL     Status: Abnormal   Collection Time    01/19/14  5:38 AM      Result Value Ref Range   Sodium 138  137 - 147 mEq/L   Potassium 3.6 (*) 3.7 - 5.3 mEq/L   Chloride 100  96 - 112 mEq/L   CO2 26  19 - 32 mEq/L   Glucose, Bld 149 (*) 70 - 99 mg/dL   BUN 11  6 - 23 mg/dL   Creatinine, Ser 0.56  0.50 - 1.10 mg/dL   Calcium 8.5  8.4 - 10.5 mg/dL   Total Protein 6.1  6.0 - 8.3 g/dL   Albumin 3.3 (*) 3.5 - 5.2 g/dL   AST 12  0 - 37 U/L   ALT 10  0 - 35 U/L   Alkaline Phosphatase 46  39 - 117 U/L   Total Bilirubin 0.2 (*) 0.3 - 1.2 mg/dL   GFR calc non Af Amer >90  >90 mL/min   GFR calc Af Amer >90  >90 mL/min   Comment: (NOTE)     The eGFR has been calculated using the CKD EPI equation.     This calculation has not been validated in all clinical  situations.     eGFR's persistently <90 mL/min signify possible Chronic Kidney     Disease.   Anion gap 12  5 - 15  CBC     Status: Abnormal   Collection Time    01/19/14  5:38 AM      Result Value Ref Range   WBC 10.5  4.0 - 10.5 K/uL   RBC 3.67 (*) 3.87 - 5.11 MIL/uL   Hemoglobin 11.4 (*) 12.0 - 15.0 g/dL   HCT 35.3 (*) 36.0 - 46.0 %   MCV 96.2  78.0 - 100.0 fL   MCH 31.1  26.0 - 34.0 pg   MCHC 32.3  30.0 - 36.0 g/dL   RDW 12.8  11.5 - 15.5 %   Platelets 288  150 - 400 K/uL  HIV ANTIBODY (ROUTINE TESTING)     Status: None   Collection Time    01/19/14  5:38 AM      Result Value Ref Range   HIV 1&2 Ab, 4th Generation NONREACTIVE  NONREACTIVE   Comment: (NOTE)     A NONREACTIVE HIV Ag/Ab result does not exclude HIV infection since     the time frame for seroconversion is variable. If acute HIV infection     is suspected, a HIV-1 RNA Qualitative TMA test is recommended.     HIV-1/2 Antibody Diff         Not indicated.     HIV-1 RNA, Qual TMA           Not indicated.     PLEASE NOTE: This information has been disclosed to you from records     whose confidentiality may be protected by state law. If your state     requires such protection, then the state law prohibits you from making     any further disclosure of the information without the specific written     consent of the person to whom it pertains, or as otherwise  permitted     by law. A general authorization for the release of medical or other     information is NOT sufficient for this purpose.     The performance of this assay has not been clinically validated in     patients less than 77 years old.     Performed at [facility] PCR SCREENING     Status: None   Collection Time    01/19/14 12:53 PM      Result Value Ref Range   MRSA by PCR NEGATIVE  NEGATIVE   Comment:            The GeneXpert MRSA Assay (FDA     approved for NASAL specimens     only), is one component of a     comprehensive MRSA  colonization     surveillance program. It is not     intended to diagnose MRSA     infection nor to guide or     monitor treatment for     MRSA infections.    Dg Elbow Complete Left  01/18/2014   CLINICAL DATA:  Pain and wound overlying the olecranon  EXAM: LEFT ELBOW - COMPLETE 3+ VIEW  COMPARISON:  Concurrently obtained radiographs of the forearm  FINDINGS: There is no evidence of fracture, dislocation, or joint effusion. There is no evidence of arthropathy or other focal bone abnormality. Soft tissues are unremarkable.  IMPRESSION: Negative.   Electronically Signed   By: Jacqulynn Cadet M.D.   On: 01/18/2014 17:32   Dg Forearm Left  01/18/2014   CLINICAL DATA:  Pain and swelling distal to the olecranon  EXAM: LEFT FOREARM - 2 VIEW  COMPARISON:  Concurrently obtained radiographs of the elbow  FINDINGS: No acute fracture or malalignment. Normal bony mineralization without lytic or blastic osseous lesion. No subcutaneous emphysema or retained radiopaque foreign body. There is soft tissue swelling over the dorsal aspect of the proximal and mid forearm.  IMPRESSION: Focal soft tissue swelling over the dorsal aspect of the proximal and mid forearm without evidence of retained radiopaque foreign body, subcutaneous emphysema or acute osseous abnormality.   Electronically Signed   By: Jacqulynn Cadet M.D.   On: 01/18/2014 17:33   US Renal  01/19/2014   CLINICAL DATA:  28 year old female with pyelonephritis.  EXAM: RENAL/URINARY TRACT ULTRASOUND COMPLETE  COMPARISON:  None.  FINDINGS: Right Kidney:  Length: 11.3 cm. Echogenicity within normal limits. No mass, collection or hydronephrosis visualized.  Left Kidney:  Length: 11.7 cm. Echogenicity within normal limits. No mass, collection or hydronephrosis visualized.  Bladder:  Appears normal for degree of distention. Normal bilateral ureteral jets are present.  IMPRESSION: Normal renal ultrasound.   Electronically Signed   By: Hassan Rowan M.D.   On:  01/19/2014 11:45   Mr Elbow Left Wo Contrast  01/20/2014   CLINICAL DATA:  Left elbow cellulitis. Pain and swelling. History of MRSA.  EXAM: MRI OF THE LEFT ELBOW WITHOUT CONTRAST  TECHNIQUE: Multiplanar, multisequence MR imaging of the elbow was performed. No intravenous contrast was administered.  COMPARISON:  None.  FINDINGS: There is a focal 18 x 8 x 5 mm subcutaneous abscess deep to the draining wound. The abscess extends medially from the wound.  TENDONS  Common forearm flexor origin: Normal.  Common forearm extensor origin: Normal.  Biceps: Normal.  Triceps: Normal.  LIGAMENTS  Medial stabilizers: Normal.  Lateral stabilizers:  Normal.  Cartilage: Normal.  Joint: Normal.  No joint  effusion.  Cubital tunnel: Normal.  Bones: Normal.  IMPRESSION: Subcutaneous abscess and adjacent cellulitis along the dorsal aspect of the elbow. No extension into the muscles or tendons or joint. No osteomyelitis.   Electronically Signed   By: Rozetta Nunnery M.D.   On: 01/20/2014 12:47    Review of Systems  Constitutional: Positive for fever and chills.  Musculoskeletal: Positive for joint pain.  Neurological: Positive for headaches.   Blood pressure 107/61, pulse 70, temperature 98.3 F (36.8 C), temperature source Oral, resp. rate 20, height _0  (1.6 m), weight 140 lb 1.6 oz (63.549 kg), last menstrual period 01/04/2014, SpO2 98.00%. Physical Exam  Vitals reviewed. Constitutional: She is oriented to person, place, and time. She appears well-developed and well-nourished. No distress.  HENT:  Head: Normocephalic and atraumatic.  Eyes: Pupils are equal, round, and reactive to light.  Cardiovascular: Normal rate and intact distal pulses.   Musculoskeletal: She exhibits edema and tenderness.       Left shoulder: She exhibits decreased range of motion. She exhibits no tenderness, no bony tenderness, no swelling, no crepitus, no deformity and normal strength.       Left elbow: She exhibits decreased range of  motion and swelling. She exhibits no effusion, no deformity and no laceration. Tenderness found. Olecranon process tenderness noted.       Left wrist: Normal.       Left hand: Normal.  Lymphadenopathy:    She has no cervical adenopathy.    She has no axillary adenopathy.  Neurological: She is alert and oriented to person, place, and time. She has normal reflexes.  Skin: Skin is warm. There is erythema.  Left elbow draining wound surrounding erythema    Assessment/Plan: Mri   Abscess subq-left elbow area   xrays normal   rec I/D left elbow   Arther Abbott 01/20/2014, 3:49 PM

## 2014-01-21 ENCOUNTER — Encounter (HOSPITAL_COMMUNITY): Payer: Medicaid - Out of State | Admitting: Anesthesiology

## 2014-01-21 ENCOUNTER — Encounter (HOSPITAL_COMMUNITY)
Admission: EM | Disposition: A | Discharge status: Home Health | Payer: Self-pay | Source: Home / Self Care | Attending: Internal Medicine

## 2014-01-21 ENCOUNTER — Inpatient Hospital Stay (HOSPITAL_COMMUNITY): Payer: Medicaid - Out of State | Admitting: Anesthesiology

## 2014-01-21 ENCOUNTER — Encounter (HOSPITAL_COMMUNITY): Payer: Self-pay | Admitting: *Deleted

## 2014-01-21 DIAGNOSIS — L0291 Cutaneous abscess, unspecified: Secondary | ICD-10-CM | POA: Diagnosis present

## 2014-01-21 DIAGNOSIS — L0390 Cellulitis, unspecified: Secondary | ICD-10-CM

## 2014-01-21 HISTORY — PX: INCISION AND DRAINAGE ABSCESS: SHX5864

## 2014-01-21 LAB — CBC
HEMATOCRIT: 35.6 % — AB (ref 36.0–46.0)
Hemoglobin: 11.6 g/dL — ABNORMAL LOW (ref 12.0–15.0)
MCH: 30.8 pg (ref 26.0–34.0)
MCHC: 32.6 g/dL (ref 30.0–36.0)
MCV: 94.4 fL (ref 78.0–100.0)
Platelets: 325 10*3/uL (ref 150–400)
RBC: 3.77 MIL/uL — AB (ref 3.87–5.11)
RDW: 12.4 % (ref 11.5–15.5)
WBC: 8.5 10*3/uL (ref 4.0–10.5)

## 2014-01-21 LAB — BASIC METABOLIC PANEL
Anion gap: 11 (ref 5–15)
BUN: 8 mg/dL (ref 6–23)
CHLORIDE: 101 meq/L (ref 96–112)
CO2: 28 meq/L (ref 19–32)
CREATININE: 0.63 mg/dL (ref 0.50–1.10)
Calcium: 9.1 mg/dL (ref 8.4–10.5)
GFR calc non Af Amer: >90 mL/min (ref >90)
GLUCOSE: 81 mg/dL (ref 70–99)
POTASSIUM: 4.1 meq/L (ref 3.7–5.3)
Sodium: 140 mEq/L (ref 137–147)

## 2014-01-21 LAB — CULTURE, ROUTINE-ABSCESS
GRAM STAIN: NONE SEEN
Special Requests: NORMAL

## 2014-01-21 LAB — SURGICAL PCR SCREEN
MRSA, PCR: INVALID — AB
STAPHYLOCOCCUS AUREUS: INVALID — AB

## 2014-01-21 SURGERY — INCISION AND DRAINAGE, ABSCESS
Anesthesia: General | Site: Elbow | Laterality: Left

## 2014-01-21 MED ORDER — MIDAZOLAM HCL 5 MG/5ML IJ SOLN
INTRAMUSCULAR | Status: DC | PRN
  Administered 2014-01-21: 2 mg via INTRAVENOUS

## 2014-01-21 MED ORDER — FENTANYL CITRATE 0.05 MG/ML IJ SOLN
INTRAMUSCULAR | Status: AC
  Filled 2014-01-21: qty 2

## 2014-01-21 MED ORDER — PROPOFOL 10 MG/ML IV BOLUS
INTRAVENOUS | Status: DC | PRN
  Administered 2014-01-21: 200 mg via INTRAVENOUS

## 2014-01-21 MED ORDER — FENTANYL CITRATE 0.05 MG/ML IJ SOLN: 25.0000 ug | INTRAMUSCULAR | Status: DC | PRN

## 2014-01-21 MED ORDER — ONDANSETRON HCL 4 MG/2ML IJ SOLN: 4.0000 mg | Freq: Once | INTRAMUSCULAR | Status: DC | PRN

## 2014-01-21 MED ORDER — MIDAZOLAM HCL 2 MG/2ML IJ SOLN
INTRAMUSCULAR | Status: AC
  Filled 2014-01-21: qty 2

## 2014-01-21 MED ORDER — SODIUM CHLORIDE 0.9 % IR SOLN
Status: DC | PRN
  Administered 2014-01-21: 1000 mL

## 2014-01-21 MED ORDER — DIPHENHYDRAMINE HCL 50 MG/ML IJ SOLN
25.0000 mg | Freq: Once | INTRAMUSCULAR | Status: AC
  Administered 2014-01-21: 22:00:00 via INTRAVENOUS
  Filled 2014-01-21: qty 1

## 2014-01-21 MED ORDER — FENTANYL CITRATE 0.05 MG/ML IJ SOLN
INTRAMUSCULAR | Status: AC
  Filled 2014-01-21: qty 5

## 2014-01-21 MED ORDER — PROPOFOL 10 MG/ML IV BOLUS
INTRAVENOUS | Status: AC
  Filled 2014-01-21: qty 20

## 2014-01-21 MED ORDER — FENTANYL CITRATE 0.05 MG/ML IJ SOLN
INTRAMUSCULAR | Status: DC | PRN
  Administered 2014-01-21: 50 ug via INTRAVENOUS
  Administered 2014-01-21 (×4): 25 ug via INTRAVENOUS
  Administered 2014-01-21 (×2): 50 ug via INTRAVENOUS

## 2014-01-21 MED ORDER — MIDAZOLAM HCL 2 MG/2ML IJ SOLN
1.0000 mg | INTRAMUSCULAR | Status: DC | PRN
  Administered 2014-01-21 (×2): 2 mg via INTRAVENOUS
  Filled 2014-01-21: qty 2

## 2014-01-21 MED ORDER — HYDROMORPHONE HCL PF 1 MG/ML IJ SOLN
1.0000 mg | INTRAMUSCULAR | Status: DC | PRN
  Administered 2014-01-21 – 2014-01-22 (×5): 1 mg via INTRAVENOUS
  Filled 2014-01-21 (×5): qty 1

## 2014-01-21 MED ORDER — FENTANYL CITRATE 0.05 MG/ML IJ SOLN
25.0000 ug | INTRAMUSCULAR | Status: AC
  Administered 2014-01-21 (×2): 25 ug via INTRAVENOUS

## 2014-01-21 MED ORDER — FLUCONAZOLE 100 MG PO TABS
100.0000 mg | ORAL_TABLET | Freq: Every day | ORAL | Status: DC
  Administered 2014-01-21 – 2014-01-23 (×3): 100 mg via ORAL
  Filled 2014-01-21 (×3): qty 1

## 2014-01-21 MED ORDER — ONDANSETRON HCL 4 MG/2ML IJ SOLN
INTRAMUSCULAR | Status: AC
  Filled 2014-01-21: qty 2

## 2014-01-21 MED ORDER — ONDANSETRON HCL 4 MG/2ML IJ SOLN
4.0000 mg | Freq: Once | INTRAMUSCULAR | Status: AC
  Administered 2014-01-21: 4 mg via INTRAVENOUS

## 2014-01-21 MED ORDER — LACTATED RINGERS IV SOLN
INTRAVENOUS | Status: DC
  Administered 2014-01-21: 12:00:00 via INTRAVENOUS

## 2014-01-21 MED ORDER — LIDOCAINE HCL 1 % IJ SOLN
INTRAMUSCULAR | Status: DC | PRN
  Administered 2014-01-21: 50 mg via INTRADERMAL

## 2014-01-21 MED ORDER — GLYCOPYRROLATE 0.2 MG/ML IJ SOLN
INTRAMUSCULAR | Status: AC
  Filled 2014-01-21: qty 1

## 2014-01-21 MED ORDER — GLYCOPYRROLATE 0.2 MG/ML IJ SOLN
0.2000 mg | Freq: Once | INTRAMUSCULAR | Status: AC
  Administered 2014-01-21: 0.2 mg via INTRAVENOUS

## 2014-01-21 MED ORDER — FENTANYL CITRATE 0.05 MG/ML IJ SOLN
25.0000 ug | INTRAMUSCULAR | Status: DC | PRN
  Administered 2014-01-21 (×4): 50 ug via INTRAVENOUS

## 2014-01-21 MED ORDER — SODIUM CHLORIDE 0.9 % IV SOLN
INTRAVENOUS | Status: DC
  Administered 2014-01-22: 05:00:00 via INTRAVENOUS

## 2014-01-21 SURGICAL SUPPLY — 37 items
BAG HAMPER (MISCELLANEOUS) ×3 IMPLANT
BANDAGE COBAN STERILE 2 (GAUZE/BANDAGES/DRESSINGS) ×3 IMPLANT
BANDAGE ELASTIC 4 VELCRO NS (GAUZE/BANDAGES/DRESSINGS) ×3 IMPLANT
BANDAGE ESMARK 4X12 BL STRL LF (DISPOSABLE) ×1 IMPLANT
BLADE SURG 15 STRL LF DISP TIS (BLADE) ×1 IMPLANT
BLADE SURG 15 STRL SS (BLADE) ×2
BNDG ESMARK 4X12 BLUE STRL LF (DISPOSABLE) ×3
BNDG GAUZE ELAST 4 BULKY (GAUZE/BANDAGES/DRESSINGS) ×3 IMPLANT
CLOTH BEACON ORANGE TIMEOUT ST (SAFETY) ×3 IMPLANT
COVER LIGHT HANDLE STERIS (MISCELLANEOUS) ×6 IMPLANT
ELECT REM PT RETURN 9FT ADLT (ELECTROSURGICAL) ×3
ELECTRODE REM PT RTRN 9FT ADLT (ELECTROSURGICAL) ×1 IMPLANT
GAUZE PACKING IODOFORM 1X5 (MISCELLANEOUS) ×3 IMPLANT
GAUZE SPONGE 4X4 12PLY STRL (GAUZE/BANDAGES/DRESSINGS) ×3 IMPLANT
GLOVE BIOGEL PI IND STRL 7.5 (GLOVE) ×1 IMPLANT
GLOVE BIOGEL PI INDICATOR 7.5 (GLOVE) ×2
GLOVE EXAM NITRILE MD LF STRL (GLOVE) ×3 IMPLANT
GLOVE OPTIFIT SS 8.0 STRL (GLOVE) ×3 IMPLANT
GLOVE SKINSENSE NS SZ8.0 LF (GLOVE) ×2
GLOVE SKINSENSE STRL SZ8.0 LF (GLOVE) ×1 IMPLANT
GLOVE SURG SS PI 7.5 STRL IVOR (GLOVE) ×3 IMPLANT
GOWN STRL REUS W/TWL LRG LVL3 (GOWN DISPOSABLE) ×3 IMPLANT
GOWN STRL REUS W/TWL XL LVL3 (GOWN DISPOSABLE) ×3 IMPLANT
INST SET MINOR BONE (KITS) ×3 IMPLANT
KIT ROOM TURNOVER APOR (KITS) ×3 IMPLANT
MANIFOLD NEPTUNE II (INSTRUMENTS) ×3 IMPLANT
MARKER SKIN DUAL TIP RULER LAB (MISCELLANEOUS) ×3 IMPLANT
NS IRRIG 1000ML POUR BTL (IV SOLUTION) ×3 IMPLANT
PACK BASIC LIMB (CUSTOM PROCEDURE TRAY) ×3 IMPLANT
PAD ABD 5X9 TENDERSORB (GAUZE/BANDAGES/DRESSINGS) ×3 IMPLANT
PAD ARMBOARD 7.5X6 YLW CONV (MISCELLANEOUS) ×3 IMPLANT
SET BASIN LINEN APH (SET/KITS/TRAYS/PACK) ×3 IMPLANT
SPONGE LAP 18X18 X RAY DECT (DISPOSABLE) ×3 IMPLANT
STOCKINETTE IMPERVIOUS LG (DRAPES) ×3 IMPLANT
SWAB CULTURE LIQ STUART DBL (MISCELLANEOUS) ×3 IMPLANT
SYR BULB IRRIGATION 50ML (SYRINGE) ×3 IMPLANT
TUBE ANAEROBIC PORT A CUL  W/M (MISCELLANEOUS) ×3 IMPLANT

## 2014-01-21 NOTE — Op Note (Signed)
01/21/2014  1:20 PM  PATIENT:  [name] [name]  28 y.o. female  PRE-OPERATIVE DIAGNOSIS:  LEFT ELBOW ABSCESS  POST-OPERATIVE DIAGNOSIS:  LEFT ELBOW ABSCESS  PROCEDURE:  Procedure(s): INCISION AND DRAINAGE LEFT ELBOW ABSCESS (Left)  Findings: small sub cutaneous abscess with purulent drainage measuring 1 x 1.5 cm length and width and approximately 1 cm in depth.  Details of procedure The patient was identified by 2 proved identification parameters in the left elbow was marked as a surgical site. The chart was reviewed and updated. The limb was reevaluated and deemed appropriate for surgery.  Patient was taken to the operating room for general anesthesia which was followed by sterile prep and drape with Betadine secondary to open draining wound  Timeout was initiated and completed  A transverse incision was made over the area of maximal tenderness and drainage purulent material was expressed and cultured finger dissection was performed to break any adhesions and to identify the extent of the lesion. The lesion was then irrigated and debrided packed with 1 inch iodoform gauze and covered with sterile dressing.  Extubation and returned to recovery room stable SURGEON:  Surgeon(s) and Role:    * Vickki Hearing, MD - Primary  PHYSICIAN ASSISTANT:   ASSISTANTS: none   ANESTHESIA:   general  EBL:  Total I/O In: 500 [I.V.:500] Out: 1000 [Urine:1000]  BLOOD ADMINISTERED:none  DRAINS: none   LOCAL MEDICATIONS USED:  NONE  SPECIMEN:   Anaerobic and aerobic culture left elbow DISPOSITION OF SPECIMEN:  N/A  COUNTS:  YES  TOURNIQUET:  * No tourniquets in log *  DICTATION: .Dragon Dictation  PLAN OF CARE: Admit to inpatient   PATIENT DISPOSITION:  PACU - hemodynamically stable.   Delay start of Pharmacological VTE agent (>24hrs) due to surgical blood loss or risk of bleeding: not applicable

## 2014-01-21 NOTE — Transfer of Care (Signed)
Immediate Anesthesia Transfer of Care Note  Patient: [name] [name]  Procedure(s) Performed: Procedure(s): INCISION AND DRAINAGE LEFT ELBOW ABSCESS (Left)  Patient Location: PACU  Anesthesia Type:General  Level of Consciousness: awake and patient cooperative  Airway & Oxygen Therapy: Patient Spontanous Breathing and Patient connected to face mask oxygen  Post-op Assessment: Report given to PACU RN, Post -op Vital signs reviewed and stable and Patient moving all extremities  Post vital signs: Reviewed and stable  Complications: No apparent anesthesia complications

## 2014-01-21 NOTE — Anesthesia Procedure Notes (Signed)
Procedure Name: LMA Insertion Date/Time: 01/21/2014 12:50 PM Performed by: Despina Hidden Pre-anesthesia Checklist: Patient identified, Emergency Drugs available, Suction available and Patient being monitored Patient Re-evaluated:Patient Re-evaluated prior to inductionOxygen Delivery Method: Circle system utilized Preoxygenation: Pre-oxygenation with 100% oxygen Intubation Type: IV induction Ventilation: Mask ventilation without difficulty LMA: LMA inserted LMA Size: 3.0 Tube type: Oral Number of attempts: 1 Placement Confirmation: positive ETCO2 and breath sounds checked- equal and bilateral Tube secured with: Tape Dental Injury: Teeth and Oropharynx as per pre-operative assessment

## 2014-01-21 NOTE — Anesthesia Preprocedure Evaluation (Signed)
Anesthesia Evaluation  Patient identified by MRN, date of birth, ID band Patient awake    Reviewed: Allergy & Precautions, H&P , NPO status , Patient's Chart, lab work & pertinent test results  History of Anesthesia Complications (+) history of anesthetic complications (hx aspiration with T&A)  Airway Mallampati: II TM Distance: >3 FB Neck ROM: Full    Dental  (+) Teeth Intact   Pulmonary neg pulmonary ROS,  breath sounds clear to auscultation        Cardiovascular negative cardio ROS  Rhythm:Regular Rate:Normal     Neuro/Psych PSYCHIATRIC DISORDERS    GI/Hepatic negative GI ROS, Celiac disease    Endo/Other    Renal/GU Pyelonephritis      Musculoskeletal   Abdominal   Peds  Hematology   Anesthesia Other Findings   Reproductive/Obstetrics                           Anesthesia Physical Anesthesia Plan  ASA: II  Anesthesia Plan: General   Post-op Pain Management:    Induction: Intravenous  Airway Management Planned: LMA  Additional Equipment:   Intra-op Plan:   Post-operative Plan: Extubation in OR  Informed Consent: I have reviewed the patients History and Physical, chart, labs and discussed the procedure including the risks, benefits and alternatives for the proposed anesthesia with the patient or authorized representative who has indicated his/her understanding and acceptance.     Plan Discussed with:   Anesthesia Plan Comments:         Anesthesia Quick Evaluation

## 2014-01-21 NOTE — Anesthesia Postprocedure Evaluation (Signed)
  Anesthesia Post-op Note  Patient: [name] [name]  Procedure(s) Performed: Procedure(s): INCISION AND DRAINAGE LEFT ELBOW ABSCESS (Left)  Patient Location: PACU  Anesthesia Type:General  Level of Consciousness: awake, alert , oriented and patient cooperative  Airway and Oxygen Therapy: Patient Spontanous Breathing  Post-op Pain: 4 /10, moderate  Post-op Assessment: Post-op Vital signs reviewed, Patient's Cardiovascular Status Stable, Respiratory Function Stable, Patent Airway and Pain level controlled  Post-op Vital Signs: Reviewed and stable  Last Vitals:  Filed Vitals:   01/21/14 1230  BP: 124/68  Pulse:   Temp:   Resp: 22    Complications: No apparent anesthesia complications

## 2014-01-21 NOTE — Progress Notes (Signed)
Patient was offered assistance with her Hibiclens bath several times, patient refused, she stated "I'll just wait until my mom get's here, she's coming around 0730." Educated patient on the fact that the Hibiclens bath should be done the night before the procedure.  Also there is no order on the chart to obtain consent for I&D, will pass this info on to the day RN.

## 2014-01-21 NOTE — Progress Notes (Signed)
[facility] PROGRESS NOTE  [name] [name] NWG:[id number] DOB: [birth year] DOA: 01/18/2014 PCP: PROVIDER NOT IN SYSTEM  Assessment/Plan: 1-SIRS/early sepsis due to left elbow cellulitis/abscess: -continue vancomycin and zosyn -continue cold compresses and arm elevation -erythema is improving; but still significantly swollen, with palpable induration and with pain -WBC's WNL; no further fever. -due to ongoing pain and induration MRI has been done and demonstrated abscess w/o osteomyelitis. -Dr. Romeo Apple consulted for I&D, planned is to take to OR later today  2-hx of MRSA infection: patient broken skin in her back, from what appears to be 2 pimples will continue bacitracin topically and vanc will cover. MRSA screening was negative, but cx from abscess came back to be positive. -will discussed with ID which antibiotics, route and length of therapy  3-UTI/yeast: culturew/o growth. Zosyn will cover infection, has received a total of 4/5 days for non complicated UTI. Improved CVA tenderness on exam. No fever. -will add diflucan; yeast probably as consequence of antibiotics use. -will discontinue zosyn after 1 more day of treatment.  4-ADHD: continue adderall  5-leukocytosis: due to #1. Will monitor -continue IV antibiotics. -CBC demonstrating WBC's back to WNL   Code Status: Full Family Communication: mother at bedside  Disposition Plan: home when infection/symptoms controlled   Consultants:  Ortho (Dr. Romeo Apple)  Procedures:  Left elbow bedside I&D (9/11)  Planned I&D in the OR (today 9/14)  Antibiotics:  Vanc  Zosyn   HPI/Subjective: Afebrile, denies nausea or vomiting. Left elbow continue to be swollen, tender and with decrease range of motion. MRI demonstrating abscess and initial culture positive for MRSA  Objective: Filed Vitals:   01/21/14 1711  BP: 111/67  Pulse: 74  Temp: 97.7 F (36.5 C)  Resp: 18    Intake/Output Summary (Last 24 hours) at  01/21/14 1714 Last data filed at 01/21/14 1330  Gross per 24 hour  Intake   1540 ml  Output   2400 ml  Net   -860 ml   Filed Weights   01/18/14 1349 01/18/14 1828  Weight: 62.596 kg (138 lb) 63.549 kg (140 lb 1.6 oz)    Exam:   General:  Afebrile, still complaining of left elbow pain, swelling and decrease range of motion.  Cardiovascular: S1 and s2, no rubs or gallops  Respiratory: CTA bilaterally  Abdomen: soft, NT, ND, positive BS; positive CVA  Musculoskeletal: left elbow swollen, with organized erythema and induration felt on exam; slight sanguineous/purulent suppuration/drainage)  Data Reviewed: Basic Metabolic Panel:  Recent Labs Lab 01/18/14 1446 01/19/14 0538 01/21/14 0517  NA 140 138 140  K 3.7 3.6* 4.1  CL 101 100 101  CO2 GLUCOSE 92 149* 81  BUN CREATININE 0.56 0.56 0.63  CALCIUM 9.4 8.5 9.1  MG  --  2.0  --   PHOS  --  3.8  --    Liver Function Tests:  Recent Labs Lab 01/19/14 0538  AST 12  ALT 10  ALKPHOS 46  BILITOT 0.2*  PROT 6.1  ALBUMIN 3.3*   CBC:  Recent Labs Lab 01/18/14 1446 01/19/14 0538 01/21/14 0517  WBC 12.5* 10.5 8.5  NEUTROABS 9.0*  --   --   HGB 11.9* 11.4* 11.6*  HCT 35.8* 35.3* 35.6*  MCV 94.0 96.2 94.4  PLT 357 288 325    Recent Results (from the past 240 hour(s))  CULTURE, ROUTINE-ABSCESS     Status: None   Collection Time    01/18/14  4:16 PM  Result Value Ref Range Status   Specimen Description OTHER   Final   Special Requests Normal   Final   Gram Stain     Final   Value: NO WBC SEEN     NO SQUAMOUS EPITHELIAL CELLS SEEN     NO ORGANISMS SEEN     Performed at [facility]   Culture     Final   Value: FEW METHICILLIN RESISTANT STAPHYLOCOCCUS AUREUS     Note: RIFAMPIN AND GENTAMICIN SHOULD NOT BE USED AS SINGLE DRUGS FOR TREATMENT OF STAPH INFECTIONS. This organism DOES NOT demonstrate inducible Clindamycin resistance in vitro. CRITICAL RESULT CALLED TO, READ BACK BY  AND VERIFIED WITH: LISA C. 9/14       BY REAMM     Performed at [facility]   Report Status 01/21/2014 FINAL   Final   Organism ID, Bacteria METHICILLIN RESISTANT STAPHYLOCOCCUS AUREUS   Final  URINE CULTURE     Status: None   Collection Time    01/18/14  7:30 PM      Result Value Ref Range Status   Specimen Description URINE, RANDOM   Final   Special Requests Normal   Final   Culture  Setup Time     Final   Value: 01/19/2014 21:33     Performed at [facility] Count     Final   Value: NO GROWTH     Performed at [facility]   Culture     Final   Value: NO GROWTH     Performed at [facility]   Report Status 01/20/2014 FINAL   Final  MRSA PCR SCREENING     Status: None   Collection Time    01/19/14 12:53 PM      Result Value Ref Range Status   MRSA by PCR NEGATIVE  NEGATIVE Final   Comment:            The GeneXpert MRSA Assay (FDA     approved for NASAL specimens     only), is one component of a     comprehensive MRSA colonization     surveillance program. It is not     intended to diagnose MRSA     infection nor to guide or     monitor treatment for     MRSA infections.  SURGICAL PCR SCREEN     Status: Abnormal   Collection Time    01/21/14  5:45 AM      Result Value Ref Range Status   MRSA, PCR INVALID RESULTS, SPECIMEN SENT FOR CULTURE (*) NEGATIVE Final   Comment: RESULT CALLED TO, READ BACK BY AND VERIFIED WITH:     COVINGTON,L. AT 1008 ON 01/21/2014 BY BAUGHAM,M.   Staphylococcus aureus INVALID RESULTS, SPECIMEN SENT FOR CULTURE (*) NEGATIVE Final   Comment: RESULT CALLED TO, READ BACK BY AND VERIFIED WITH:     COVINGTON,L. AT 1008 ON 01/21/2014 BY BAUGHAM,M.                The Xpert SA Assay (FDA     approved for NASAL specimens     in patients over 60 years of age),     is one component of     a comprehensive surveillance     program.  Test performance has     been validated by [facility] for patients  greater     than or equal to 1 year  old.     It is not intended     to diagnose infection nor to     guide or monitor treatment.     Studies: Mr Elbow Left Wo Contrast  01/20/2014   CLINICAL DATA:  Left elbow cellulitis. Pain and swelling. History of MRSA.  EXAM: MRI OF THE LEFT ELBOW WITHOUT CONTRAST  TECHNIQUE: Multiplanar, multisequence MR imaging of the elbow was performed. No intravenous contrast was administered.  COMPARISON:  None.  FINDINGS: There is a focal 18 x 8 x 5 mm subcutaneous abscess deep to the draining wound. The abscess extends medially from the wound.  TENDONS  Common forearm flexor origin: Normal.  Common forearm extensor origin: Normal.  Biceps: Normal.  Triceps: Normal.  LIGAMENTS  Medial stabilizers: Normal.  Lateral stabilizers:  Normal.  Cartilage: Normal.  Joint: Normal.  No joint effusion.  Cubital tunnel: Normal.  Bones: Normal.  IMPRESSION: Subcutaneous abscess and adjacent cellulitis along the dorsal aspect of the elbow. No extension into the muscles or tendons or joint. No osteomyelitis.   Electronically Signed   By: Geanie Cooley M.D.   On: 01/20/2014 12:47    Scheduled Meds: . amphetamine-dextroamphetamine  10 mg Oral Q1200  . amphetamine-dextroamphetamine  30 mg Oral Q breakfast  . bacitracin   Topical BID  . diphenhydrAMINE  25 mg Intravenous Q12H  . docusate sodium  100 mg Oral BID  . fluconazole  100 mg Oral Daily  . piperacillin-tazobactam (ZOSYN)  IV  3.375 g Intravenous Q8H  . vancomycin  1,000 mg Intravenous Q12H   Continuous Infusions: . sodium chloride 50 mL/hr at 01/21/14 1501    Active Problems:   Cellulitis   Sepsis   Abscess    Time spent: >30 minutes    Vassie Loll  [facility] Pager [phone]. If 7PM-7AM, please contact night-coverage at [email or web address], password [facility] 01/21/2014, 5:14 PM  LOS: 3 days

## 2014-01-21 NOTE — Plan of Care (Signed)
Problem: Phase II Progression Outcomes Goal: Wound without signs/symptoms of infection, decreasing edema Outcome: Not Met (add Reason) Wound culture MRSA positive.

## 2014-01-21 NOTE — Brief Op Note (Signed)
01/18/2014 - 01/21/2014  1:20 PM  PATIENT:  [name] [name]  28 y.o. female  PRE-OPERATIVE DIAGNOSIS:  LEFT ELBOW ABSCESS  POST-OPERATIVE DIAGNOSIS:  LEFT ELBOW ABSCESS  PROCEDURE:  Procedure(s): INCISION AND DRAINAGE LEFT ELBOW ABSCESS (Left)  Findings: small sub cutaneous abscess with purulent drainage measuring 1 x 1.5 cm length and width and approximately 1 cm in depth.  Details of procedure The patient was identified by 2 proved identification parameters in the left elbow was marked as a surgical site. The chart was reviewed and updated. The limb was reevaluated and deemed appropriate for surgery.  Patient was taken to the operating room for general anesthesia which was followed by sterile prep and drape with Betadine secondary to open draining wound  Timeout was initiated and completed  A transverse incision was made over the area of maximal tenderness and drainage purulent material was expressed and cultured finger dissection was performed to break any adhesions and to identify the extent of the lesion. The lesion was then irrigated and debrided packed with 1 inch iodoform gauze and covered with sterile dressing.  Extubation and returned to recovery room stable SURGEON:  Surgeon(s) and Role:    * Vickki Hearing, MD - Primary  PHYSICIAN ASSISTANT:   ASSISTANTS: none   ANESTHESIA:   general  EBL:  Total I/O In: 500 [I.V.:500] Out: 1000 [Urine:1000]  BLOOD ADMINISTERED:none  DRAINS: none   LOCAL MEDICATIONS USED:  NONE  SPECIMEN:   Anaerobic and aerobic culture left elbow DISPOSITION OF SPECIMEN:  N/A  COUNTS:  YES  TOURNIQUET:  * No tourniquets in log *  DICTATION: .Dragon Dictation  PLAN OF CARE: Admit to inpatient   PATIENT DISPOSITION:  PACU - hemodynamically stable.   Delay start of Pharmacological VTE agent (>24hrs) due to surgical blood loss or risk of bleeding: not applicable

## 2014-01-22 LAB — VANCOMYCIN, TROUGH: VANCOMYCIN TR: 12.6 ug/mL (ref 10.0–20.0)

## 2014-01-22 MED ORDER — OXYCODONE-ACETAMINOPHEN 5-325 MG PO TABS: 2.0000 | ORAL_TABLET | ORAL | Status: DC | PRN

## 2014-01-22 MED ORDER — DSS 100 MG PO CAPS: 100.0000 mg | ORAL_CAPSULE | Freq: Two times a day (BID) | ORAL | Status: DC

## 2014-01-22 MED ORDER — DIPHENHYDRAMINE HCL 25 MG PO CAPS
25.0000 mg | ORAL_CAPSULE | Freq: Once | ORAL | Status: AC
  Administered 2014-01-22: 25 mg via ORAL
  Filled 2014-01-22: qty 1

## 2014-01-22 MED ORDER — SULFAMETHOXAZOLE-TMP DS 800-160 MG PO TABS
1.0000 | ORAL_TABLET | Freq: Two times a day (BID) | ORAL | Status: DC
Start: 2014-01-22 — End: 2014-02-28

## 2014-01-22 MED ORDER — FLUCONAZOLE 100 MG PO TABS: 100.0000 mg | ORAL_TABLET | Freq: Every day | ORAL | Status: DC

## 2014-01-22 MED ORDER — HYDROMORPHONE HCL PF 1 MG/ML IJ SOLN
1.0000 mg | Freq: Four times a day (QID) | INTRAMUSCULAR | Status: DC | PRN
  Administered 2014-01-22: 1 mg via INTRAVENOUS
  Filled 2014-01-22: qty 1

## 2014-01-22 MED ORDER — SULFAMETHOXAZOLE-TMP DS 800-160 MG PO TABS
1.0000 | ORAL_TABLET | Freq: Two times a day (BID) | ORAL | Status: DC
  Administered 2014-01-22 – 2014-01-23 (×2): 1 via ORAL
  Filled 2014-01-22 (×2): qty 1

## 2014-01-22 NOTE — Discharge Summary (Addendum)
Physician Discharge Summary  [name] [name] ZOX:[id number] DOB: [birth year] DOA: 01/18/2014  PCP: PROVIDER NOT IN SYSTEM  Admit date: 01/18/2014 Discharge date: 01/23/2014  Time spent: >30 minutes  Recommendations for Outpatient Follow-up:  Check BMET to follow electrolytes and renal function after been on Bactrim Follow up 1 week with orthopedic surgeon  Discharge Diagnoses:  Active Problems:   Cellulitis   Sepsis   Abscess UTI  Yeast infection ADHD  Discharge Condition: stable and improved. Discharge home with [facility] services for wound care.  Diet recommendation: regular diet  Filed Weights   01/18/14 1349 01/18/14 1828  Weight: 62.596 kg (138 lb) 63.549 kg (140 lb 1.6 oz)    History of present illness:  28 y.o. female has a past medical history of ADHD (attention deficit hyperactivity disorder); MRSA (methicillin resistant staph aureus) culture positive; and Celiac disease. Presented with 1 week hx of left elbow swelling and pain. Started with a small bump and progressed. Her forearm started to hurt she went to [place] 2 days ago. And was put on doxycyline but did not improve. She has Had MRSA in the past. % years ago she had a large thigh abscess. She reports some chills and slightly elevated BP.She is able to move the elbow but it is limited by pain. Some nausea no vomiting. She has had some back pain as well. In ER evidence of UTI and leukocytosis. In ER she had an I &D done and wound culture were sent. Plain films unremarkable no evidence of gas in tissues. Patient noted that she had had generalized painful lymphadenopathy few months ago.   Hospital Course:  1-SIRS/early sepsis due to left MRSA elbow cellulitis/abscess: not involvement of bursa or tendons.  -WBC's WNL; no further fever.  -due to ongoing pain and induration MRI has been done and demonstrated abscess w/o osteomyelitis.  -Dr. Romeo Apple performed I&D 01/21/14; follow up with him in 1 week  -bactrim BID for 13 days  and wound care  -percocet for pain control   2-hx of MRSA infection: patient broken skin in her back, from what appears to be 2 pimples will continue bacitracin topically and vanc will cover. MRSA screening was negative, but cx from abscess came back to be positive.  -after discussing with ID plan is for 2 weeks with bactrim BID   3-UTI/yeast: culturew/o growth. Zosyn will cover infection, has received a total of 5 days for non complicated UTI. Improved CVA tenderness on exam. No fever and normal WBC's  -will add diflucan; yeast probably as consequence of antibiotics use.  -will now treated with bactrim for 13 more days.   4-ADHD: continue adderall   5-Leukocytosis: due to #1. Will monitor  -CBC demonstrating WBC's back to WNL  -will treat for 13 more days using bactrim BID as recommended by ID    Procedures: I & D (twice) 9/11(in the ED at bedside, mainly for cx's purposes) and 9/13(this one under anesthesia and done by orthopedic service)  Consultations:  Orthopedic service  ID (curbside)  Discharge Exam: Filed Vitals:   01/22/14 1454  BP: 108/59  Pulse: 64  Temp: 97.6 F (36.4 C)  Resp: 18   General: still complaining of left elbow pain; no fever.  Cardiovascular: S1 and s2, no rubs or gallops  Respiratory: CTA bilaterally  Abdomen: soft, NT, ND, positive BS; positive CVA Musculoskeletal: left elbow with intact dressing in place, no significant swelling; tender with arm movement. No major discharge appreciated  Discharge Instructions You were cared for by  a hospitalist during your hospital stay. If you have any questions about your discharge medications or the care you received while you were in the hospital after you are discharged, you can call the unit and asked to speak with the hospitalist on call if the hospitalist that took care of you is not available. Once you are discharged, your primary care physician will handle any further medical issues. Please note that NO  REFILLS for any discharge medications will be authorized once you are discharged, as it is imperative that you return to your primary care physician (or establish a relationship with a primary care physician if you do not have one) for your aftercare needs so that they can reassess your need for medications and monitor your lab values.   Current Discharge Medication List    START taking these medications   Details  docusate sodium 100 MG CAPS Take 100 mg by mouth 2 (two) times daily. Qty: 10 capsule, Refills: 0    fluconazole (DIFLUCAN) 100 MG tablet Take 1 tablet (100 mg total) by mouth daily. Qty: 5 tablet, Refills: 0    oxyCODONE-acetaminophen (PERCOCET/ROXICET) 5-325 MG per tablet Take 2 tablets by mouth every 4 (four) hours as needed for moderate pain. Qty: 40 tablet, Refills: 0    sulfamethoxazole-trimethoprim (BACTRIM DS) 800-160 MG per tablet Take 1 tablet by mouth every 12 (twelve) hours. Qty: 26 tablet, Refills: 0      CONTINUE these medications which have NOT CHANGED   Details  amphetamine-dextroamphetamine (ADDERALL) 10 MG tablet Take 10 mg by mouth daily at 12 noon. In afternoon    amphetamine-dextroamphetamine (ADDERALL) 30 MG tablet Take 30 mg by mouth daily. In AM      STOP taking these medications     doxycycline (VIBRA-TABS) 100 MG tablet      HYDROcodone-acetaminophen (NORCO/VICODIN) 5-325 MG per tablet        Allergies  Allergen Reactions  . Latex Hives  . Pineapple Hives  . Aloe Vera Hives    Can use lotions with Aloe in it but allergic to Aloe plant.   . Vancomycin     Pt get's Red Man Syndrome. Pt does ok if takes Benadryl 15-30 minutes prior.      The results of significant diagnostics from this hospitalization (including imaging, microbiology, ancillary and laboratory) are listed below for reference.    Significant Diagnostic Studies: Dg Elbow Complete Left  01/26/14   CLINICAL DATA:  Pain and wound overlying the olecranon  EXAM: LEFT  ELBOW - COMPLETE 3+ VIEW  COMPARISON:  Concurrently obtained radiographs of the forearm  FINDINGS: There is no evidence of fracture, dislocation, or joint effusion. There is no evidence of arthropathy or other focal bone abnormality. Soft tissues are unremarkable.  IMPRESSION: Negative.   Electronically Signed   By: Malachy Moan M.D.   On: 26-Jan-2014 17:32   Dg Forearm Left  January 26, 2014   CLINICAL DATA:  Pain and swelling distal to the olecranon  EXAM: LEFT FOREARM - 2 VIEW  COMPARISON:  Concurrently obtained radiographs of the elbow  FINDINGS: No acute fracture or malalignment. Normal bony mineralization without lytic or blastic osseous lesion. No subcutaneous emphysema or retained radiopaque foreign body. There is soft tissue swelling over the dorsal aspect of the proximal and mid forearm.  IMPRESSION: Focal soft tissue swelling over the dorsal aspect of the proximal and mid forearm without evidence of retained radiopaque foreign body, subcutaneous emphysema or acute osseous abnormality.   Electronically Signed   By:  Malachy Moan M.D.   On: 01/18/2014 17:33   US Renal  01/19/2014   CLINICAL DATA:  28 year old female with pyelonephritis.  EXAM: RENAL/URINARY TRACT ULTRASOUND COMPLETE  COMPARISON:  None.  FINDINGS: Right Kidney:  Length: 11.3 cm. Echogenicity within normal limits. No mass, collection or hydronephrosis visualized.  Left Kidney:  Length: 11.7 cm. Echogenicity within normal limits. No mass, collection or hydronephrosis visualized.  Bladder:  Appears normal for degree of distention. Normal bilateral ureteral jets are present.  IMPRESSION: Normal renal ultrasound.   Electronically Signed   By: Laveda Abbe M.D.   On: 01/19/2014 11:45   Mr Elbow Left Wo Contrast  01/20/2014   CLINICAL DATA:  Left elbow cellulitis. Pain and swelling. History of MRSA.  EXAM: MRI OF THE LEFT ELBOW WITHOUT CONTRAST  TECHNIQUE: Multiplanar, multisequence MR imaging of the elbow was performed. No intravenous  contrast was administered.  COMPARISON:  None.  FINDINGS: There is a focal 18 x 8 x 5 mm subcutaneous abscess deep to the draining wound. The abscess extends medially from the wound.  TENDONS  Common forearm flexor origin: Normal.  Common forearm extensor origin: Normal.  Biceps: Normal.  Triceps: Normal.  LIGAMENTS  Medial stabilizers: Normal.  Lateral stabilizers:  Normal.  Cartilage: Normal.  Joint: Normal.  No joint effusion.  Cubital tunnel: Normal.  Bones: Normal.  IMPRESSION: Subcutaneous abscess and adjacent cellulitis along the dorsal aspect of the elbow. No extension into the muscles or tendons or joint. No osteomyelitis.   Electronically Signed   By: Geanie Cooley M.D.   On: 01/20/2014 12:47    Microbiology: Recent Results (from the past 240 hour(s))  CULTURE, ROUTINE-ABSCESS     Status: None   Collection Time    01/18/14  4:16 PM      Result Value Ref Range Status   Specimen Description OTHER   Final   Special Requests Normal   Final   Gram Stain     Final   Value: NO WBC SEEN     NO SQUAMOUS EPITHELIAL CELLS SEEN     NO ORGANISMS SEEN     Performed at [facility]   Culture     Final   Value: FEW METHICILLIN RESISTANT STAPHYLOCOCCUS AUREUS     Note: RIFAMPIN AND GENTAMICIN SHOULD NOT BE USED AS SINGLE DRUGS FOR TREATMENT OF STAPH INFECTIONS. This organism DOES NOT demonstrate inducible Clindamycin resistance in vitro. CRITICAL RESULT CALLED TO, READ BACK BY AND VERIFIED WITH: LISA C. 9/14       BY REAMM     Performed at [facility]   Report Status 01/21/2014 FINAL   Final   Organism ID, Bacteria METHICILLIN RESISTANT STAPHYLOCOCCUS AUREUS   Final  URINE CULTURE     Status: None   Collection Time    01/18/14  7:30 PM      Result Value Ref Range Status   Specimen Description URINE, RANDOM   Final   Special Requests Normal   Final   Culture  Setup Time     Final   Value: 01/19/2014 21:33     Performed at [facility] Count     Final    Value: NO GROWTH     Performed at [facility]   Culture     Final   Value: NO GROWTH     Performed at [facility]   Report Status 01/20/2014 FINAL   Final  MRSA PCR SCREENING  Status: None   Collection Time    01/19/14 12:53 PM      Result Value Ref Range Status   MRSA by PCR NEGATIVE  NEGATIVE Final   Comment:            The GeneXpert MRSA Assay (FDA     approved for NASAL specimens     only), is one component of a     comprehensive MRSA colonization     surveillance program. It is not     intended to diagnose MRSA     infection nor to guide or     monitor treatment for     MRSA infections.  SURGICAL PCR SCREEN     Status: Abnormal   Collection Time    01/21/14  5:45 AM      Result Value Ref Range Status   MRSA, PCR INVALID RESULTS, SPECIMEN SENT FOR CULTURE (*) NEGATIVE Final   Comment: RESULT CALLED TO, READ BACK BY AND VERIFIED WITH:     COVINGTON,L. AT 1008 ON 01/21/2014 BY BAUGHAM,M.   Staphylococcus aureus INVALID RESULTS, SPECIMEN SENT FOR CULTURE (*) NEGATIVE Final   Comment: RESULT CALLED TO, READ BACK BY AND VERIFIED WITH:     COVINGTON,L. AT 1008 ON 01/21/2014 BY BAUGHAM,M.                The Xpert SA Assay (FDA     approved for NASAL specimens     in patients over 104 years of age),     is one component of     a comprehensive surveillance     program.  Test performance has     been validated by [employer] for patients greater     than or equal to 26 year old.     It is not intended     to diagnose infection nor to     guide or monitor treatment.  MRSA CULTURE     Status: None   Collection Time    01/21/14 10:10 AM      Result Value Ref Range Status   Specimen Description NOSE NASAL SWAB   Final   Special Requests NONE   Final   Culture     Final   Value: NO SUSPICIOUS COLONIES, CONTINUING TO HOLD     Performed at [facility]   Report Status PENDING   Incomplete  CULTURE, ROUTINE-ABSCESS     Status: None   Collection  Time    01/21/14  1:08 PM      Result Value Ref Range Status   Specimen Description ABSCESS LEFT ELBOW   Final   Special Requests VANCOMYCIN = 1gm ZOSYN = 3.375gm   Final   Gram Stain     Final   Value: FEW WBC PRESENT, PREDOMINANTLY PMN     NO SQUAMOUS EPITHELIAL CELLS SEEN     RARE GRAM POSITIVE COCCI     IN PAIRS     Performed at [facility]   Culture     Final   Value: Culture reincubated for better growth     Performed at [facility]   Report Status PENDING   Incomplete  ANAEROBIC CULTURE     Status: None   Collection Time    01/21/14  1:08 PM      Result Value Ref Range Status   Specimen Description ABSCESS LEFT ELBOW   Final   Special Requests VANCOMYCIN = 1gm ZOSYN = 3.375gm  Final   Gram Stain PENDING   Incomplete   Culture     Final   Value: NO ANAEROBES ISOLATED; CULTURE IN PROGRESS FOR 5 DAYS     Performed at [facility]   Report Status PENDING   Incomplete     Labs: Basic Metabolic Panel:  Recent Labs Lab 01/18/14 1446 01/19/14 0538 01/21/14 0517  NA 140 138 140  K 3.7 3.6* 4.1  CL 101 100 101  CO2 GLUCOSE 92 149* 81  BUN CREATININE 0.56 0.56 0.63  CALCIUM 9.4 8.5 9.1  MG  --  2.0  --   PHOS  --  3.8  --    Liver Function Tests:  Recent Labs Lab 01/19/14 0538  AST 12  ALT 10  ALKPHOS 46  BILITOT 0.2*  PROT 6.1  ALBUMIN 3.3*   CBC:  Recent Labs Lab 01/18/14 1446 01/19/14 0538 01/21/14 0517  WBC 12.5* 10.5 8.5  NEUTROABS 9.0*  --   --   HGB 11.9* 11.4* 11.6*  HCT 35.8* 35.3* 35.6*  MCV 94.0 96.2 94.4  PLT 357 288 325    Signed:  Vassie Loll  [facility] 01/22/2014, 5:49 PM

## 2014-01-22 NOTE — Addendum Note (Signed)
Addendum created 01/22/14 1131 by Moshe Salisbury, CRNA   Modules edited: Notes Section   Notes Section:  File: [id number]

## 2014-01-22 NOTE — Progress Notes (Signed)
[facility] PROGRESS NOTE  [name] [name] ZOX:[id number] DOB: 1985-07-03 DOA: 01/18/2014 PCP: PROVIDER NOT IN SYSTEM  Assessment/Plan: 1-SIRS/early sepsis due to left MRSA elbow cellulitis/abscess: not involvement of bursa or tendons. -WBC's WNL; no further fever. -due to ongoing pain and induration MRI has been done and demonstrated abscess w/o osteomyelitis. -Dr. Romeo Apple performed I&D 01/21/14; follow up with him in 1 week -bactrim BID for 13 days and wound care -percocet for pain control  2-hx of MRSA infection: patient broken skin in her back, from what appears to be 2 pimples will continue bacitracin topically and vanc will cover. MRSA screening was negative, but cx from abscess came back to be positive. -after discussing with ID plan is for 2 weeks with bactrim BID  3-UTI/yeast: culturew/o growth. Zosyn will cover infection, has received a total of 5 days for non complicated UTI. Improved CVA tenderness on exam. No fever and normal WBC's -will add diflucan; yeast probably as consequence of antibiotics use. -will now treated with bactrim for 13 more days.  4-ADHD: continue adderall  5-Leukocytosis: due to #1. Will monitor -CBC demonstrating WBC's back to WNL -will treat for 13 more days using bactrim BID as recommended by ID   Code Status: Full Family Communication: mother at bedside  Disposition Plan: home when infection/symptoms controlled   Consultants:  Ortho (Dr. Romeo Apple)  ID curbside (Dr. Ilsa Iha)  Procedures:  Left elbow bedside I&D (9/11)  Planned I&D in the OR (today 9/14)  Antibiotics:  Vanc 9/11>>9/15  Zosyn 9/13>>9/15  Bactrim 9/15>> (plan to treat for 14 days total; counting from after I&D)  HPI/Subjective: Afebrile, denies nausea or vomiting. Left elbow continue to be painful. Still with decrease range of motion. S/p left abscess I&D (post day #1).  Objective: Filed Vitals:   01/22/14 1454  BP: 108/59  Pulse: 64  Temp: 97.6 F (36.4  C)  Resp: 18    Intake/Output Summary (Last 24 hours) at 01/22/14 1739 Last data filed at 01/22/14 1200  Gross per 24 hour  Intake   1130 ml  Output   2000 ml  Net   -870 ml   Filed Weights   01/18/14 1349 01/18/14 1828  Weight: 62.596 kg (138 lb) 63.549 kg (140 lb 1.6 oz)    Exam:   General:  still complaining of left elbow pain; no fever.  Cardiovascular: S1 and s2, no rubs or gallops  Respiratory: CTA bilaterally  Abdomen: soft, NT, ND, positive BS; positive CVA  Musculoskeletal: left elbow with intact dressing in place, no significant swelling; tender with arm movement. No major discharge appreciated  Data Reviewed: Basic Metabolic Panel:  Recent Labs Lab 01/18/14 1446 01/19/14 0538 01/21/14 0517  NA 140 138 140  K 3.7 3.6* 4.1  CL 101 100 101  CO2 GLUCOSE 92 149* 81  BUN CREATININE 0.56 0.56 0.63  CALCIUM 9.4 8.5 9.1  MG  --  2.0  --   PHOS  --  3.8  --    Liver Function Tests:  Recent Labs Lab 01/19/14 0538  AST 12  ALT 10  ALKPHOS 46  BILITOT 0.2*  PROT 6.1  ALBUMIN 3.3*   CBC:  Recent Labs Lab 01/18/14 1446 01/19/14 0538 01/21/14 0517  WBC 12.5* 10.5 8.5  NEUTROABS 9.0*  --   --   HGB 11.9* 11.4* 11.6*  HCT 35.8* 35.3* 35.6*  MCV 94.0 96.2 94.4  PLT 357 288 325    Recent Results (from the  past 240 hour(s))  CULTURE, ROUTINE-ABSCESS     Status: None   Collection Time    01/18/14  4:16 PM      Result Value Ref Range Status   Specimen Description OTHER   Final   Special Requests Normal   Final   Gram Stain     Final   Value: NO WBC SEEN     NO SQUAMOUS EPITHELIAL CELLS SEEN     NO ORGANISMS SEEN     Performed at [facility]   Culture     Final   Value: FEW METHICILLIN RESISTANT STAPHYLOCOCCUS AUREUS     Note: RIFAMPIN AND GENTAMICIN SHOULD NOT BE USED AS SINGLE DRUGS FOR TREATMENT OF STAPH INFECTIONS. This organism DOES NOT demonstrate inducible Clindamycin resistance in vitro. CRITICAL RESULT  CALLED TO, READ BACK BY AND VERIFIED WITH: LISA C. 9/14       BY REAMM     Performed at [facility]   Report Status 01/21/2014 FINAL   Final   Organism ID, Bacteria METHICILLIN RESISTANT STAPHYLOCOCCUS AUREUS   Final  URINE CULTURE     Status: None   Collection Time    01/18/14  7:30 PM      Result Value Ref Range Status   Specimen Description URINE, RANDOM   Final   Special Requests Normal   Final   Culture  Setup Time     Final   Value: 01/19/2014 21:33     Performed at [facility] Count     Final   Value: NO GROWTH     Performed at [facility]   Culture     Final   Value: NO GROWTH     Performed at [facility]   Report Status 01/20/2014 FINAL   Final  MRSA PCR SCREENING     Status: None   Collection Time    01/19/14 12:53 PM      Result Value Ref Range Status   MRSA by PCR NEGATIVE  NEGATIVE Final   Comment:            The GeneXpert MRSA Assay (FDA     approved for NASAL specimens     only), is one component of a     comprehensive MRSA colonization     surveillance program. It is not     intended to diagnose MRSA     infection nor to guide or     monitor treatment for     MRSA infections.  SURGICAL PCR SCREEN     Status: Abnormal   Collection Time    01/21/14  5:45 AM      Result Value Ref Range Status   MRSA, PCR INVALID RESULTS, SPECIMEN SENT FOR CULTURE (*) NEGATIVE Final   Comment: RESULT CALLED TO, READ BACK BY AND VERIFIED WITH:     COVINGTON,L. AT 1008 ON 01/21/2014 BY BAUGHAM,M.   Staphylococcus aureus INVALID RESULTS, SPECIMEN SENT FOR CULTURE (*) NEGATIVE Final   Comment: RESULT CALLED TO, READ BACK BY AND VERIFIED WITH:     COVINGTON,L. AT 1008 ON 01/21/2014 BY BAUGHAM,M.                The Xpert SA Assay (FDA     approved for NASAL specimens     in patients over 60 years of age),     is one component of     a comprehensive surveillance     program.  Test performance  has     been validated by [employer] for patients greater     than or equal to 12 year old.     It is not intended     to diagnose infection nor to     guide or monitor treatment.  MRSA CULTURE     Status: None   Collection Time    01/21/14 10:10 AM      Result Value Ref Range Status   Specimen Description NOSE NASAL SWAB   Final   Special Requests NONE   Final   Culture     Final   Value: NO SUSPICIOUS COLONIES, CONTINUING TO HOLD     Performed at [facility]   Report Status PENDING   Incomplete  CULTURE, ROUTINE-ABSCESS     Status: None   Collection Time    01/21/14  1:08 PM      Result Value Ref Range Status   Specimen Description ABSCESS LEFT ELBOW   Final   Special Requests VANCOMYCIN = 1gm ZOSYN = 3.375gm   Final   Gram Stain     Final   Value: FEW WBC PRESENT, PREDOMINANTLY PMN     NO SQUAMOUS EPITHELIAL CELLS SEEN     RARE GRAM POSITIVE COCCI     IN PAIRS     Performed at [facility]   Culture     Final   Value: Culture reincubated for better growth     Performed at [facility]   Report Status PENDING   Incomplete  ANAEROBIC CULTURE     Status: None   Collection Time    01/21/14  1:08 PM      Result Value Ref Range Status   Specimen Description ABSCESS LEFT ELBOW   Final   Special Requests VANCOMYCIN = 1gm ZOSYN = 3.375gm   Final   Gram Stain PENDING   Incomplete   Culture     Final   Value: NO ANAEROBES ISOLATED; CULTURE IN PROGRESS FOR 5 DAYS     Performed at [facility]   Report Status PENDING   Incomplete     Studies: No results found.  Scheduled Meds: . amphetamine-dextroamphetamine  10 mg Oral Q1200  . amphetamine-dextroamphetamine  30 mg Oral Q breakfast  . bacitracin   Topical BID  . diphenhydrAMINE  25 mg Intravenous Q12H  . docusate sodium  100 mg Oral BID  . fluconazole  100 mg Oral Daily  . sulfamethoxazole-trimethoprim  1 tablet Oral Q12H   Continuous Infusions: . sodium chloride 50 mL/hr at 01/22/14 2130    Active Problems:    Cellulitis   Sepsis   Abscess    Time spent: < 30 minutes    Vassie Loll  [facility] Pager [phone]. If 7PM-7AM, please contact night-coverage at [email or web address], password [facility] 01/22/2014, 5:39 PM  LOS: 4 days

## 2014-01-22 NOTE — Progress Notes (Signed)
ANTIBIOTIC CONSULT NOTE -   Pharmacy Consult for Vancomycin and Zosyn Indication: cellulitis  Allergies  Allergen Reactions  . Latex Hives  . Pineapple Hives  . Aloe Vera Hives    Can use lotions with Aloe in it but allergic to Aloe plant.   . Vancomycin     Pt get's Red Man Syndrome. Pt does ok if takes Benadryl 15-30 minutes prior.   Patient Measurements: Height:  (160 cm) Weight: 140 lb 1.6 oz (63.549 kg) IBW/kg (Calculated) : 52.4  Vital Signs: Temp: 97.8 F (36.6 C) (09/15 0450) Temp src: Oral (09/15 0450) BP: 97/49 mmHg (09/15 0450) Pulse Rate: 62 (09/15 0450) Intake/Output from previous day: 09/14 0701 - 09/15 0700 In: 1412.9 [P.O.:480; I.V.:732.9; IV Piggyback:200] Out: 3000 [Urine:3000] Intake/Output from this shift: Total I/O In: 240 [P.O.:240] Out: -   Labs:  Recent Labs  01/21/14 0517  WBC 8.5  HGB 11.6*  PLT 325  CREATININE 0.63   Estimated Creatinine Clearance: 93 ml/min (by C-G formula based on Cr of 0.63).  Recent Labs  01/22/14 0548  VANCOTROUGH 12.6    Microbiology: Recent Results (from the past 720 hour(s))  CULTURE, ROUTINE-ABSCESS     Status: None   Collection Time    01/18/14  4:16 PM      Result Value Ref Range Status   Specimen Description OTHER   Final   Special Requests Normal   Final   Gram Stain     Final   Value: NO WBC SEEN     NO SQUAMOUS EPITHELIAL CELLS SEEN     NO ORGANISMS SEEN     Performed at [facility]   Culture     Final   Value: FEW METHICILLIN RESISTANT STAPHYLOCOCCUS AUREUS     Note: RIFAMPIN AND GENTAMICIN SHOULD NOT BE USED AS SINGLE DRUGS FOR TREATMENT OF STAPH INFECTIONS. This organism DOES NOT demonstrate inducible Clindamycin resistance in vitro. CRITICAL RESULT CALLED TO, READ BACK BY AND VERIFIED WITH: LISA C. 9/14       BY REAMM     Performed at [facility]   Report Status 01/21/2014 FINAL   Final   Organism ID, Bacteria METHICILLIN RESISTANT STAPHYLOCOCCUS AUREUS    Final  URINE CULTURE     Status: None   Collection Time    01/18/14  7:30 PM      Result Value Ref Range Status   Specimen Description URINE, RANDOM   Final   Special Requests Normal   Final   Culture  Setup Time     Final   Value: 01/19/2014 21:33     Performed at [facility] Count     Final   Value: NO GROWTH     Performed at [facility]   Culture     Final   Value: NO GROWTH     Performed at [facility]   Report Status 01/20/2014 FINAL   Final  MRSA PCR SCREENING     Status: None   Collection Time    01/19/14 12:53 PM      Result Value Ref Range Status   MRSA by PCR NEGATIVE  NEGATIVE Final   Comment:            The GeneXpert MRSA Assay (FDA     approved for NASAL specimens     only), is one component of a     comprehensive MRSA colonization     surveillance program. It is  not     intended to diagnose MRSA     infection nor to guide or     monitor treatment for     MRSA infections.  SURGICAL PCR SCREEN     Status: Abnormal   Collection Time    01/21/14  5:45 AM      Result Value Ref Range Status   MRSA, PCR INVALID RESULTS, SPECIMEN SENT FOR CULTURE (*) NEGATIVE Final   Comment: RESULT CALLED TO, READ BACK BY AND VERIFIED WITH:     COVINGTON,L. AT 1008 ON 01/21/2014 BY BAUGHAM,M.   Staphylococcus aureus INVALID RESULTS, SPECIMEN SENT FOR CULTURE (*) NEGATIVE Final   Comment: RESULT CALLED TO, READ BACK BY AND VERIFIED WITH:     COVINGTON,L. AT 1008 ON 01/21/2014 BY BAUGHAM,M.                The Xpert SA Assay (FDA     approved for NASAL specimens     in patients over 67 years of age),     is one component of     a comprehensive surveillance     program.  Test performance has     been validated by [employer] for patients greater     than or equal to 29 year old.     It is not intended     to diagnose infection nor to     guide or monitor treatment.  MRSA CULTURE     Status: None   Collection Time    01/21/14 10:10 AM       Result Value Ref Range Status   Specimen Description NOSE NASAL SWAB   Final   Special Requests NONE   Final   Culture     Final   Value: NO SUSPICIOUS COLONIES, CONTINUING TO HOLD     Performed at [facility]   Report Status PENDING   Incomplete  CULTURE, ROUTINE-ABSCESS     Status: None   Collection Time    01/21/14  1:08 PM      Result Value Ref Range Status   Specimen Description ABSCESS LEFT ELBOW   Final   Special Requests VANCOMYCIN = 1gm ZOSYN = 3.375gm   Final   Gram Stain PENDING   Incomplete   Culture     Final   Value: Culture reincubated for better growth     Performed at [facility]   Report Status PENDING   Incomplete   Medical History: Past Medical History  Diagnosis Date  . ADHD (attention deficit hyperactivity disorder)   . MRSA (methicillin resistant staph aureus) culture positive   . Celiac disease   . Complication of anesthesia     pt aspirated when being put to sleep for tonsillectomy in past    Anti-infectives   Start     Dose/Rate Route Frequency Ordered Stop   01/21/14 1000  fluconazole (DIFLUCAN) tablet 100 mg     100 mg Oral Daily 01/21/14 0924 01/26/14 0959   01/20/14 1500  piperacillin-tazobactam (ZOSYN) IVPB 3.375 g     3.375 g 12.5 mL/hr over 240 Minutes Intravenous Every 8 hours 01/20/14 1331     01/19/14 0300  vancomycin (VANCOCIN) IVPB 1000 mg/200 mL premix     1,000 mg 200 mL/hr over 60 Minutes Intravenous Every 12 hours 01/18/14 1839     01/18/14 2000  cefTRIAXone (ROCEPHIN) 1 g in dextrose 5 % 50 mL IVPB  Status:  Discontinued  1 g 100 mL/hr over 30 Minutes Intravenous Every 24 hours 01/18/14 1856 01/20/14 1329   01/18/14 1830  cefTRIAXone (ROCEPHIN) 1 g in dextrose 5 % 50 mL IVPB  Status:  Discontinued     1 g 100 mL/hr over 30 Minutes Intravenous Every 24 hours 01/18/14 1813 01/18/14 1856   01/18/14 1500  vancomycin (VANCOCIN) IVPB 1000 mg/200 mL premix     1,000 mg 200 mL/hr over 60 Minutes Intravenous   Once 01/18/14 1445 01/18/14 1646     Assessment: 28yo female admitted with cellulitis.  Pt has good renal fxn.  MD to broaden coverage with Zosyn.  Trough level is on target for cellulitis.  Goal of Therapy:  Trough level 10-15 Eradicate infection.  Plan:  Continue Vancomycin 1GM IV q12hrs Check trough level weekly or sooner if indicated continue Zosyn 3.375gm IV q8hrs, each dose over 4 hrs Monitor labs, renal fxn, and cultures  Margo Aye, Geramy Lamorte A 01/22/2014,10:32 AM

## 2014-01-22 NOTE — Anesthesia Postprocedure Evaluation (Signed)
  Anesthesia Post-op Note  Patient: [name] [name]  Procedure(s) Performed: Procedure(s): INCISION AND DRAINAGE LEFT ELBOW ABSCESS (Left)  Patient Location: Nursing Unit  Anesthesia Type:General  Level of Consciousness: awake, alert  and oriented  Airway and Oxygen Therapy: Patient Spontanous Breathing  Post-op Pain: none  Post-op Assessment: Post-op Vital signs reviewed  Post-op Vital Signs: Reviewed and stable  Last Vitals:  Filed Vitals:   01/22/14 0450  BP: 97/49  Pulse: 62  Temp: 36.6 C  Resp: 18    Complications: No apparent anesthesia complications

## 2014-01-23 ENCOUNTER — Encounter (HOSPITAL_COMMUNITY): Payer: Self-pay | Admitting: Orthopedic Surgery

## 2014-01-23 LAB — MRSA CULTURE

## 2014-01-23 MED ORDER — ONDANSETRON HCL 4 MG PO TABS: 4.0000 mg | ORAL_TABLET | Freq: Four times a day (QID) | ORAL | Status: DC | PRN

## 2014-01-23 NOTE — Care Management Note (Signed)
    Page 1 of 1   01/23/2014     9:40:07 AM CARE MANAGEMENT NOTE 01/23/2014  Patient:  ChristinaRodgers   Account Number:  [id number]  Date Initiated:  01/23/2014  Documentation initiated by:  Sharrie Rothman  Subjective/Objective Assessment:   Pt admitted from home with elbow abcess. Pt lives with her mother and will return home at discharge. Pt is independent with ADL's.     Action/Plan:   MATCH voucher given as pt does not have any insurance. Pt denies any need for HH at discharge. Pt stated her grandmother, aunt, and cousin are nurses and can assist with dressing changes. Pt able to learn as well.   Anticipated DC Date:  01/23/2014   Anticipated DC Plan:  HOME/SELF CARE      DC Planning Services  CM consult  MATCH Program      Choice offered to / List presented to:             Status of service:  Completed, signed off Medicare Important Message given?   (If response is "NO", the following Medicare IM given date fields will be blank) Date Medicare IM given:   Medicare IM given by:   Date Additional Medicare IM given:   Additional Medicare IM given by:    Discharge Disposition:  HOME/SELF CARE  Per UR Regulation:    If discussed at Long Length of Stay Meetings, dates discussed:    Comments:  01/23/14 0940 [name] Queen, RN BSN CM

## 2014-01-23 NOTE — Progress Notes (Signed)
Dressing changed prior to discharge and patient educated on how to change dressing.  Incision was clean and dry.  Wet to dry dressing was applied.

## 2014-01-23 NOTE — Progress Notes (Signed)
Patient's IV was removed and was clean, dry and intact at removal.  Patient and family received discharge instructions, handouts, scripts, education on dressing changes, and had no further questions/concerns.  Patient was escorted to vehicle via wheel chair by patient advocate.

## 2014-01-23 NOTE — Progress Notes (Signed)
UR chart review completed.  

## 2014-01-24 LAB — CULTURE, ROUTINE-ABSCESS

## 2014-01-26 LAB — ANAEROBIC CULTURE

## 2014-01-31 ENCOUNTER — Encounter: Payer: Self-pay | Admitting: Orthopedic Surgery

## 2014-01-31 ENCOUNTER — Telehealth: Payer: Self-pay | Admitting: Orthopedic Surgery

## 2014-01-31 ENCOUNTER — Ambulatory Visit (INDEPENDENT_AMBULATORY_CARE_PROVIDER_SITE_OTHER): Payer: Self-pay | Admitting: Orthopedic Surgery

## 2014-01-31 VITALS — BP 89/55 | Ht 63.0 in | Wt 140.1 lb

## 2014-01-31 DIAGNOSIS — L0390 Cellulitis, unspecified: Principal | ICD-10-CM

## 2014-01-31 DIAGNOSIS — L0291 Cutaneous abscess, unspecified: Secondary | ICD-10-CM

## 2014-01-31 MED ORDER — OXYCODONE-ACETAMINOPHEN 5-325 MG PO TABS: 2.0000 | ORAL_TABLET | ORAL | Status: DC | PRN

## 2014-01-31 MED ORDER — ONDANSETRON HCL 4 MG PO TABS: 4.0000 mg | ORAL_TABLET | Freq: Four times a day (QID) | ORAL | Status: DC | PRN

## 2014-01-31 NOTE — Patient Instructions (Signed)
Change dressing daily

## 2014-01-31 NOTE — Progress Notes (Signed)
28 year old female status post incision and drainage abscess left elbow currently on Bactrim DS Percocet and Zofran  Dressing is removed and she has a 2.5 cm x 3mm open wound of the left elbow with a clean bed no streaking cellulitis  Recommend dressing changes daily continue Bactrim for 6 additional days continue Percocet and Zofran and return in 2 weeks

## 2014-01-31 NOTE — Telephone Encounter (Signed)
[place] and to [place] out-of[place]state provider, not credentialed with this plan, treated on emergency basis while on call at [facility].  Reached automated voice message system, representatives available after 10:00a.m - call back to follow up then.

## 2014-02-06 NOTE — Telephone Encounter (Signed)
02/06/14 per phone with Medicaid [place], per Meg, patient had active coverage in September of 2015 ONLY  for Ob-Gyn-pregnancy benefits; therefore, no coverage on orthopedic services provided by Dr Romeo AppleHarrison.  ZOX#[id number]#[id number]

## 2014-02-18 ENCOUNTER — Ambulatory Visit: Payer: Medicaid - Out of State | Admitting: Orthopedic Surgery

## 2014-02-18 ENCOUNTER — Encounter: Payer: Self-pay | Admitting: Orthopedic Surgery

## 2014-02-28 ENCOUNTER — Ambulatory Visit (INDEPENDENT_AMBULATORY_CARE_PROVIDER_SITE_OTHER): Payer: Medicaid - Out of State | Admitting: Orthopedic Surgery

## 2014-02-28 ENCOUNTER — Encounter: Payer: Self-pay | Admitting: Orthopedic Surgery

## 2014-02-28 VITALS — BP 130/83 | Ht 63.0 in | Wt 140.1 lb

## 2014-02-28 DIAGNOSIS — L0291 Cutaneous abscess, unspecified: Secondary | ICD-10-CM

## 2014-02-28 DIAGNOSIS — G5622 Lesion of ulnar nerve, left upper limb: Secondary | ICD-10-CM

## 2014-02-28 DIAGNOSIS — L0390 Cellulitis, unspecified: Secondary | ICD-10-CM

## 2014-02-28 MED ORDER — SULFAMETHOXAZOLE-TMP DS 800-160 MG PO TABS: 1.0000 | ORAL_TABLET | Freq: Two times a day (BID) | ORAL | Status: DC

## 2014-02-28 MED ORDER — ONDANSETRON HCL 4 MG PO TABS: 4.0000 mg | ORAL_TABLET | Freq: Four times a day (QID) | ORAL | Status: DC | PRN

## 2014-02-28 MED ORDER — GABAPENTIN 100 MG PO CAPS: 100.0000 mg | ORAL_CAPSULE | Freq: Three times a day (TID) | ORAL | Status: DC

## 2014-02-28 NOTE — Patient Instructions (Addendum)
Meds ordered this encounter  Medications  . DISCONTD: sulfamethoxazole-trimethoprim (BACTRIM DS) 800-160 MG per tablet    Sig: Take 1 tablet by mouth every 12 (twelve) hours.    Dispense:  20 tablet    Refill:  0  . gabapentin (NEURONTIN) 100 MG capsule    Sig: Take 1 capsule (100 mg total) by mouth 3 (three) times daily.    Dispense:  90 capsule    Refill:  2  . sulfamethoxazole-trimethoprim (BACTRIM DS) 800-160 MG per tablet    Sig: Take 1 tablet by mouth every 12 (twelve) hours.    Dispense:  20 tablet    Refill:  0

## 2014-02-28 NOTE — Progress Notes (Signed)
Patient ID: [name] IvanoffLindsey Wan, female   DOB: 03-09-1986, 28 y.o.   MRN: [id number]  Chief Complaint  Patient presents with  . Follow-up    left elbow sore with drainage    The patient comes in after incision and drainage of left elbow abscess still complaining of a radiating pain up to her shoulders along the medial side of the arm and associated with some tingling in the small and ring finger.  She saw some drainage from the abscessed area.  No fever or chills.  BP 130/83  Ht 5\' 3"  (1.6 m)  Wt 140 lb 1.6 oz (63.549 kg)  BMI 24.82 kg/m2 The patient is awake alert and oriented x3 mood and affect are normal She has full range of motion of the left elbow and a small transverse incision over the previous abscess which is tender she is also tender over the cubital tunnel with a positive Tinel's but no weakness in grip shoulder motion is normal there is no erythema there is no axillary lymphadenopathy  Diagnosis cubital tunnel Diagnosis previous abscess  Recommend repeat Bactrim for 10 days Start gabapentin 100 mg 3 times a day followup as needed

## 2014-02-28 NOTE — Addendum Note (Signed)
Addended by: Vickki HearingHARRISON, Audwin Semper E on: 02/28/2014 05:15 PM   Modules accepted: Level of Service

## 2014-05-06 ENCOUNTER — Emergency Department (HOSPITAL_COMMUNITY)
Admission: EM | Admit: 2014-05-06 | Discharge: 2014-05-06 | Disposition: A | Discharge status: Home / Self Care | Payer: Medicaid - Out of State | Attending: Emergency Medicine | Admitting: Emergency Medicine

## 2014-05-06 ENCOUNTER — Encounter (HOSPITAL_COMMUNITY): Payer: Self-pay | Admitting: Emergency Medicine

## 2014-05-06 DIAGNOSIS — H9209 Otalgia, unspecified ear: Secondary | ICD-10-CM | POA: Insufficient documentation

## 2014-05-06 DIAGNOSIS — Z8719 Personal history of other diseases of the digestive system: Secondary | ICD-10-CM | POA: Insufficient documentation

## 2014-05-06 DIAGNOSIS — Z79899 Other long term (current) drug therapy: Secondary | ICD-10-CM | POA: Insufficient documentation

## 2014-05-06 DIAGNOSIS — F909 Attention-deficit hyperactivity disorder, unspecified type: Secondary | ICD-10-CM | POA: Insufficient documentation

## 2014-05-06 DIAGNOSIS — Z8614 Personal history of Methicillin resistant Staphylococcus aureus infection: Secondary | ICD-10-CM | POA: Insufficient documentation

## 2014-05-06 DIAGNOSIS — Y9289 Other specified places as the place of occurrence of the external cause: Secondary | ICD-10-CM | POA: Insufficient documentation

## 2014-05-06 DIAGNOSIS — Y9389 Activity, other specified: Secondary | ICD-10-CM | POA: Insufficient documentation

## 2014-05-06 DIAGNOSIS — Z792 Long term (current) use of antibiotics: Secondary | ICD-10-CM | POA: Insufficient documentation

## 2014-05-06 DIAGNOSIS — S61210A Laceration without foreign body of right index finger without damage to nail, initial encounter: Secondary | ICD-10-CM | POA: Insufficient documentation

## 2014-05-06 DIAGNOSIS — Y288XXA Contact with other sharp object, undetermined intent, initial encounter: Secondary | ICD-10-CM | POA: Insufficient documentation

## 2014-05-06 DIAGNOSIS — S61411A Laceration without foreign body of right hand, initial encounter: Secondary | ICD-10-CM

## 2014-05-06 DIAGNOSIS — Z91040 Latex allergy status: Secondary | ICD-10-CM | POA: Insufficient documentation

## 2014-05-06 DIAGNOSIS — Y998 Other external cause status: Secondary | ICD-10-CM | POA: Insufficient documentation

## 2014-05-06 MED ORDER — LIDOCAINE HCL (PF) 1 % IJ SOLN
INTRAMUSCULAR | Status: AC
  Administered 2014-05-06: 19:00:00
  Filled 2014-05-06: qty 5

## 2014-05-06 NOTE — ED Notes (Signed)
Pt reports cut hand on a tin roof. No active bleeding. Small laceration noted to knuckle of right hand distal to right index finger.

## 2014-05-06 NOTE — ED Provider Notes (Signed)
CSN: [id number]     Arrival date & time 05/06/14  1531 History  This chart was scribed for non-physician practitioner, Ivery QualeHobson Clarita Mcelvain, PA-C working with Raeford RazorStephen Kohut, MD by Luisa DagoPriscilla Tutu, ED scribe. This patient was seen in room [facility] and the patient's care was started at 5:53 PM.    Chief Complaint  Patient presents with  . Laceration   The history is provided by the patient. No language interpreter was used.   HPI Comments: [name] [name] is a 28 y.o. female with a PMhx of ADHD, MRSA, and celiac disease presents to the Emergency Department complaining of laceration to her knuckle of her right index finger that occurred 3 hours ago. She states that her last tetanus vaccine was in 2011. Pt is also complaining of bilateral otalgia. Denies any fever, chills, nausea, emesis, numbness, or weakness.   Past Medical History  Diagnosis Date  . ADHD (attention deficit hyperactivity disorder)   . MRSA (methicillin resistant staph aureus) culture positive   . Celiac disease   . Complication of anesthesia     pt aspirated when being put to sleep for tonsillectomy in past    Past Surgical History  Procedure Laterality Date  . Tonsillectomy  2004  . Incision and drainage abscess Left 01/21/2014    Procedure: INCISION AND DRAINAGE LEFT ELBOW ABSCESS;  Surgeon: Vickki HearingStanley E Harrison, MD;  Location: [facility];  Service: Orthopedics;  Laterality: Left;   Family History  Problem Relation Age of Onset  . Stroke Mother   . Cancer - Other Father    History  Substance Use Topics  . Smoking status: Never Smoker   . Smokeless tobacco: Not on file  . Alcohol Use: No   OB History    No data available     Review of Systems  Constitutional: Negative for fever and chills.  HENT: Positive for ear pain and sinus pressure.   Respiratory: Negative for cough.   Gastrointestinal: Negative for nausea and vomiting.  Skin: Positive for wound.  All other systems reviewed and are  negative.     Allergies  Latex; Pineapple; Aloe vera; and Vancomycin  Home Medications   Prior to Admission medications   Medication Sig Start Date End Date Taking? Authorizing Provider  amphetamine-dextroamphetamine (ADDERALL) 10 MG tablet Take 10 mg by mouth daily at 12 noon. In afternoon    Historical Provider, MD  amphetamine-dextroamphetamine (ADDERALL) 30 MG tablet Take 30 mg by mouth daily. In AM    Historical Provider, MD  docusate sodium 100 MG CAPS Take 100 mg by mouth 2 (two) times daily. 01/22/14   Vassie Lollarlos Madera, MD  fluconazole (DIFLUCAN) 100 MG tablet Take 1 tablet (100 mg total) by mouth daily. 01/22/14   Vassie Lollarlos Madera, MD  gabapentin (NEURONTIN) 100 MG capsule Take 1 capsule (100 mg total) by mouth 3 (three) times daily. 02/28/14   Vickki HearingStanley E Harrison, MD  ondansetron (ZOFRAN) 4 MG tablet Take 1 tablet (4 mg total) by mouth every 6 (six) hours as needed for nausea. 02/28/14   Vickki HearingStanley E Harrison, MD  oxyCODONE-acetaminophen (PERCOCET/ROXICET) 5-325 MG per tablet Take 2 tablets by mouth every 4 (four) hours as needed for moderate pain. 01/31/14   Vickki HearingStanley E Harrison, MD  sulfamethoxazole-trimethoprim (BACTRIM DS) 800-160 MG per tablet Take 1 tablet by mouth every 12 (twelve) hours. 02/28/14   Vickki HearingStanley E Harrison, MD   Triage Vitals:BP 141/74 mmHg  Pulse 92  Temp(Src) 99 F (37.2 C) (Oral)  Resp 16  Ht 5\' 3"  (1.6  m)  Wt 140 lb (63.504 kg)  BMI 24.81 kg/m2  SpO2 99%  LMP 05/05/2014  Physical Exam  Constitutional: She is oriented to person, place, and time. She appears well-developed and well-nourished. No distress.  HENT:  Head: Normocephalic and atraumatic.  No blood behind the TM bilaterally.   Eyes: Conjunctivae and EOM are normal.  Neck: Neck supple.  Cardiovascular: Normal rate.   Pulmonary/Chest: Effort normal. No respiratory distress.  Musculoskeletal: Normal range of motion.  No involved on of the web space between the first and second finger.   Lymphadenopathy:    She has no cervical adenopathy.  Neurological: She is alert and oriented to person, place, and time.  FROM of the second finger on the right. No sensory deficits of the second finger of the right hand.   Skin: Skin is warm and dry.  1.0 cm laceration of the dorsal second MP joint.   Psychiatric: She has a normal mood and affect. Her behavior is normal.  Nursing note and vitals reviewed.   ED Course  Procedures   DIAGNOSTIC STUDIES: Oxygen Saturation is 99% on RA, normal by my interpretation.    COORDINATION OF CARE: 5:57 PM-Will perform a laceration repair. Pt advised of plan for treatment and pt agrees.  Medications  lidocaine (PF) (XYLOCAINE) 1 % injection (not administered)    LACERATION REPAIR Performed by: Ivery QualeHobson Krysteena Stalker, PA-C Consent: Verbal consent obtained. Risks and benefits: risks, benefits and alternatives were discussed Patient identity confirmed: provided demographic data Time out performed prior to procedure Prepped and Draped in normal sterile fashion Wound explored Laceration Location: second dorsal MP joint on the right Laceration Length: 1.0 cm No Foreign Bodies seen or palpated Anesthesia: local infiltration Local anesthetic: lidocaine 1.0% without epinephrine Anesthetic total: 3.0 ml Irrigation method: syringe Amount of cleaning: standard Skin closure: 4.0 Nylon sutures Number of sutures or staples: 4 Technique: simple interrupted Patient tolerance: Patient tolerated the procedure well with no immediate complications.  Labs Review Labs Reviewed - No data to display  Imaging Review No results found.   EKG Interpretation None      MDM  Pt sustained a laceration of the right index finger. No bone  Or tendon involvement.  Laceration repaired. Pt to have sutures removed in one week.   Final diagnoses:  None    *I have reviewed nursing notes, vital signs, and all appropriate lab and imaging results for this  patient.**  **I personally performed the services described in this documentation, which was scribed in my presence. The recorded information has been reviewed and is accurate.Kathie Dike*   Matsue Strom M Loa Idler, PA-C 05/06/14 Carlis [id number]  Raeford RazorStephen Kohut, MD 05/09/14 [id number]

## 2014-05-06 NOTE — Discharge Instructions (Signed)
Stitches, Staples, or Skin Adhesive Strips  °Stitches (sutures), staples, and skin adhesive strips hold the skin together as it heals. They will usually be in place for 7 days or less. °HOME CARE °· Wash your hands with soap and water before and after you touch your wound. °· Only take medicine as told by your doctor. °· Cover your wound only if your doctor told you to. Otherwise, leave it open to air. °· Do not get your stitches wet or dirty. If they get dirty, dab them gently with a clean washcloth. Wet the washcloth with soapy water. Do not rub. Pat them dry gently. °· Do not put medicine or medicated cream on your stitches unless your doctor told you to. °· Do not take out your own stitches or staples. Skin adhesive strips will fall off by themselves. °· Do not pick at the wound. Picking can cause an infection. °· Do not miss your follow-up appointment. °· If you have problems or questions, call your doctor. °GET HELP RIGHT AWAY IF:  °· You have a temperature by mouth above 102° F (38.9° C), not controlled by medicine. °· You have chills. °· You have redness or pain around your stitches. °· There is puffiness (swelling) around your stitches. °· You notice fluid (drainage) from your stitches. °· There is a bad smell coming from your wound. °MAKE SURE YOU: °· Understand these instructions. °· Will watch your condition. °· Will get help if you are not doing well or get worse. °Document Released: 02/21/2009 Document Revised: 07/19/2011 Document Reviewed: 02/21/2009 °Exit[employer]® Patient Information ©2015 [employer]. This information is not intended to replace advice given to you by your health care provider. Make sure you discuss any questions you have with your health care provider. ° °

## 2014-05-06 NOTE — ED Notes (Signed)
Covered wound with sterile gauze and wrapped with sterile gauze wrap. Patient tolerated well.

## 2015-01-16 ENCOUNTER — Emergency Department (HOSPITAL_COMMUNITY)
Admission: EM | Admit: 2015-01-16 | Discharge: 2015-01-16 | Disposition: A | Discharge status: Home / Self Care | Payer: Medicaid - Out of State | Attending: Emergency Medicine | Admitting: Emergency Medicine

## 2015-01-16 ENCOUNTER — Encounter (HOSPITAL_COMMUNITY): Payer: Self-pay | Admitting: Emergency Medicine

## 2015-01-16 DIAGNOSIS — R591 Generalized enlarged lymph nodes: Secondary | ICD-10-CM

## 2015-01-16 DIAGNOSIS — F909 Attention-deficit hyperactivity disorder, unspecified type: Secondary | ICD-10-CM | POA: Insufficient documentation

## 2015-01-16 DIAGNOSIS — R590 Localized enlarged lymph nodes: Secondary | ICD-10-CM | POA: Insufficient documentation

## 2015-01-16 DIAGNOSIS — Z8719 Personal history of other diseases of the digestive system: Secondary | ICD-10-CM | POA: Insufficient documentation

## 2015-01-16 DIAGNOSIS — Z79899 Other long term (current) drug therapy: Secondary | ICD-10-CM | POA: Insufficient documentation

## 2015-01-16 DIAGNOSIS — Z91040 Latex allergy status: Secondary | ICD-10-CM | POA: Insufficient documentation

## 2015-01-16 LAB — CBC WITH DIFFERENTIAL/PLATELET
BASOS ABS: 0 10*3/uL (ref 0.0–0.1)
Basophils Relative: 0 % (ref 0–1)
Eosinophils Absolute: 0.2 10*3/uL (ref 0.0–0.7)
Eosinophils Relative: 2 % (ref 0–5)
HCT: 38.2 % (ref 36.0–46.0)
Hemoglobin: 12.8 g/dL (ref 12.0–15.0)
Lymphocytes Relative: 37 % (ref 12–46)
Lymphs Abs: 3.1 10*3/uL (ref 0.7–4.0)
MCH: 31.4 pg (ref 26.0–34.0)
MCHC: 33.5 g/dL (ref 30.0–36.0)
MCV: 93.9 fL (ref 78.0–100.0)
Monocytes Absolute: 0.5 10*3/uL (ref 0.1–1.0)
Monocytes Relative: 6 % (ref 3–12)
Neutro Abs: 4.6 10*3/uL (ref 1.7–7.7)
Neutrophils Relative %: 55 % (ref 43–77)
Platelets: 330 10*3/uL (ref 150–400)
RBC: 4.07 MIL/uL (ref 3.87–5.11)
RDW: 12.5 % (ref 11.5–15.5)
WBC: 8.3 10*3/uL (ref 4.0–10.5)

## 2015-01-16 MED ORDER — CEPHALEXIN 500 MG PO CAPS
500.0000 mg | ORAL_CAPSULE | Freq: Once | ORAL | Status: AC
Start: 2015-01-16 — End: 2015-01-16
  Administered 2015-01-16: 500 mg via ORAL
  Filled 2015-01-16: qty 1

## 2015-01-16 MED ORDER — CEPHALEXIN 500 MG PO CAPS: 500.0000 mg | ORAL_CAPSULE | Freq: Four times a day (QID) | ORAL | Status: DC

## 2015-01-16 NOTE — ED Notes (Addendum)
PT c/o lymph nodes swelling to posterior left neck into neck x3 weeks worsening and becoming painful over the past week. PT also c/o headache off and on for the past week and experienced fever x1 day ago.

## 2015-01-16 NOTE — ED Notes (Signed)
Pt states understanding of care given and follow up instructions.  Ambulated to mother's ED room after discharge

## 2015-01-16 NOTE — Discharge Instructions (Signed)

## 2015-01-16 NOTE — ED Notes (Signed)
Pt states that this began about 2 months ago when she started "blacking out" He reports having blacked out 3-4 times, with the most recent being 5 weeks ago. Pt states that she has been stressed and is complaining of headache and neck pain bilaterally. 7/10. Feel like sometimes the bump on her shoulder is "leaking cold fluid into her." Lymph node on the left shoulder is palpable, tender and swollen. Pt endorses fever 2 days ago. No fever at this time. Neuro assessment WNL.

## 2015-01-16 NOTE — ED Provider Notes (Signed)
CSN: [id number]     Arrival date & time 01/16/15  1804 History   First MD Initiated Contact with Patient 01/16/15 1850     Chief Complaint  Patient presents with  . Lymphadenopathy     (Consider location/radiation/quality/duration/timing/severity/associated sxs/prior Treatment) HPI Comments: The patient is a 29 year old female, she states that over the last several weeks she has had a lump on the trapezius area posteriorly, this had been much larger but seems to go down in size. She states that she has had no other areas of swelling including her armpit, she recalls having a bug bite on her arm in the distant past and was treated by an orthopedist, the symptoms kind of went away and she has none of that at this time. Denies fevers, chills nausea vomiting.  The history is provided by the patient.    Past Medical History  Diagnosis Date  . ADHD (attention deficit hyperactivity disorder)   . MRSA (methicillin resistant staph aureus) culture positive   . Celiac disease   . Complication of anesthesia     pt aspirated when being put to sleep for tonsillectomy in past    Past Surgical History  Procedure Laterality Date  . Tonsillectomy  2004  . Incision and drainage abscess Left 01/21/2014    Procedure: INCISION AND DRAINAGE LEFT ELBOW ABSCESS;  Surgeon: Vickki Hearing, MD;  Location: [facility];  Service: Orthopedics;  Laterality: Left;   Family History  Problem Relation Age of Onset  . Stroke Mother   . Cancer - Other Father    Social History  Substance Use Topics  . Smoking status: Never Smoker   . Smokeless tobacco: None  . Alcohol Use: No   OB History    No data available     Review of Systems  All other systems reviewed and are negative.     Allergies  Latex; Pineapple; Aloe vera; and Vancomycin  Home Medications   Prior to Admission medications   Medication Sig Start Date End Date Taking? Authorizing Provider  amphetamine-dextroamphetamine (ADDERALL) 10 MG  tablet Take 10 mg by mouth at bedtime. In afternoon   Yes Historical Provider, MD  amphetamine-dextroamphetamine (ADDERALL) 30 MG tablet Take 30 mg by mouth daily. In AM   Yes Historical Provider, MD  gabapentin (NEURONTIN) 100 MG capsule Take 1 capsule (100 mg total) by mouth 3 (three) times daily. 02/28/14  Yes Vickki Hearing, MD  cephALEXin (KEFLEX) 500 MG capsule Take 1 capsule (500 mg total) by mouth 4 (four) times daily. 01/16/15   Eber Hong, MD  docusate sodium 100 MG CAPS Take 100 mg by mouth 2 (two) times daily. Patient not taking: Reported on 01/16/2015 01/22/14   Vassie Loll, MD  fluconazole (DIFLUCAN) 100 MG tablet Take 1 tablet (100 mg total) by mouth daily. Patient not taking: Reported on 01/16/2015 01/22/14   Vassie Loll, MD  ondansetron (ZOFRAN) 4 MG tablet Take 1 tablet (4 mg total) by mouth every 6 (six) hours as needed for nausea. Patient not taking: Reported on 01/16/2015 02/28/14   Vickki Hearing, MD  oxyCODONE-acetaminophen (PERCOCET/ROXICET) 5-325 MG per tablet Take 2 tablets by mouth every 4 (four) hours as needed for moderate pain. Patient not taking: Reported on 01/16/2015 01/31/14   Vickki Hearing, MD  sulfamethoxazole-trimethoprim (BACTRIM DS) 800-160 MG per tablet Take 1 tablet by mouth every 12 (twelve) hours. Patient not taking: Reported on 01/16/2015 02/28/14   Vickki Hearing, MD   BP 121/75 mmHg  Pulse  96  Temp(Src) 98.4 F (36.9 C) (Oral)  Resp 20  Ht  (1.6 m)  Wt 150 lb (68.04 kg)  BMI 26.58 kg/m2  SpO2 100%  LMP 01/12/2015 Physical Exam  Constitutional: She appears well-developed and well-nourished.  HENT:  Head: Normocephalic and atraumatic.  Eyes: Conjunctivae are normal. Right eye exhibits no discharge. Left eye exhibits no discharge.  Pulmonary/Chest: Effort normal. No respiratory distress.  Lymphadenopathy:  Isolated 1 cm mass to the left posterior trapezius, no overlying redness or induration, no fluctuance, this area is tender.  Bedside ultrasound reveals a single approximately 1 cm oval structure, this is noncompressible, it is very tender. It does appear mobile. There is no other areas of lymphadenopathy in the supraclavicular, axillary, epitrochlear areas.  Neurological: She is alert. Coordination normal.  Skin: Skin is warm and dry. No rash noted. She is not diaphoretic. No erythema.  Psychiatric: She has a normal mood and affect.  Nursing note and vitals reviewed.   ED Course  Procedures (including critical care time) Labs Review Labs Reviewed  CBC WITH DIFFERENTIAL/PLATELET    Imaging Review No results found. I have personally reviewed and evaluated these images and lab results as part of my medical decision-making.    MDM   Final diagnoses:  Lymphadenopathy    No lesions on the scalp, no other upper respiratory symptoms, isolated lump to the back of the left trapezius, discussed with patient that she needs to have close follow-up and will need biopsy if she does not get better with antibiotics.  Consider infection / CA, lymphoma - CBC ordered.  CBC neg, pt informed  Meds given in ED:  Medications  cephALEXin (KEFLEX) capsule 500 mg (500 mg Oral Given 01/16/15 1935)    New Prescriptions   CEPHALEXIN (KEFLEX) 500 MG CAPSULE    Take 1 capsule (500 mg total) by mouth 4 (four) times daily.        Eber Hong, MD 01/16/15 2031

## 2016-02-23 ENCOUNTER — Emergency Department (HOSPITAL_COMMUNITY)
Admission: EM | Admit: 2016-02-23 | Discharge: 2016-02-23 | Disposition: A | Discharge status: Home / Self Care | Payer: Medicaid - Out of State | Attending: Emergency Medicine | Admitting: Emergency Medicine

## 2016-02-23 ENCOUNTER — Encounter (HOSPITAL_COMMUNITY): Payer: Self-pay | Admitting: Emergency Medicine

## 2016-02-23 ENCOUNTER — Emergency Department (HOSPITAL_COMMUNITY): Payer: Medicaid - Out of State

## 2016-02-23 DIAGNOSIS — Z79899 Other long term (current) drug therapy: Secondary | ICD-10-CM | POA: Insufficient documentation

## 2016-02-23 DIAGNOSIS — J219 Acute bronchiolitis, unspecified: Secondary | ICD-10-CM | POA: Insufficient documentation

## 2016-02-23 DIAGNOSIS — J209 Acute bronchitis, unspecified: Secondary | ICD-10-CM

## 2016-02-23 DIAGNOSIS — F909 Attention-deficit hyperactivity disorder, unspecified type: Secondary | ICD-10-CM | POA: Insufficient documentation

## 2016-02-23 LAB — CBC WITH DIFFERENTIAL/PLATELET
Basophils Absolute: 0.1 10*3/uL (ref 0.0–0.1)
Basophils Relative: 1 %
EOS PCT: 14 %
Eosinophils Absolute: 2.2 10*3/uL — ABNORMAL HIGH (ref 0.0–0.7)
HCT: 42 % (ref 36.0–46.0)
HEMOGLOBIN: 13.9 g/dL (ref 12.0–15.0)
LYMPHS ABS: 4.1 10*3/uL — AB (ref 0.7–4.0)
LYMPHS PCT: 26 %
MCH: 30.4 pg (ref 26.0–34.0)
MCHC: 33.1 g/dL (ref 30.0–36.0)
MCV: 91.9 fL (ref 78.0–100.0)
Monocytes Absolute: 1.1 10*3/uL — ABNORMAL HIGH (ref 0.1–1.0)
Monocytes Relative: 7 %
Neutro Abs: 8.4 10*3/uL — ABNORMAL HIGH (ref 1.7–7.7)
Neutrophils Relative %: 52 %
PLATELETS: 366 10*3/uL (ref 150–400)
RBC: 4.57 MIL/uL (ref 3.87–5.11)
RDW: 12.9 % (ref 11.5–15.5)
WBC: 15.8 10*3/uL — AB (ref 4.0–10.5)

## 2016-02-23 LAB — COMPREHENSIVE METABOLIC PANEL
ALK PHOS: 61 U/L (ref 38–126)
ALT: 18 U/L (ref 14–54)
AST: 22 U/L (ref 15–41)
Albumin: 4.7 g/dL (ref 3.5–5.0)
Anion gap: 9 (ref 5–15)
BILIRUBIN TOTAL: 0.3 mg/dL (ref 0.3–1.2)
BUN: 10 mg/dL (ref 6–20)
CALCIUM: 9.5 mg/dL (ref 8.9–10.3)
CO2: 24 mmol/L (ref 22–32)
CREATININE: 0.57 mg/dL (ref 0.44–1.00)
Chloride: 104 mmol/L (ref 101–111)
GFR calc non Af Amer: >60 mL/min (ref >60)
Glucose, Bld: 98 mg/dL (ref 65–99)
Potassium: 3.6 mmol/L (ref 3.5–5.1)
Sodium: 137 mmol/L (ref 135–145)
TOTAL PROTEIN: 7.7 g/dL (ref 6.5–8.1)

## 2016-02-23 LAB — D-DIMER, QUANTITATIVE (NOT AT ARMC)

## 2016-02-23 MED ORDER — ALBUTEROL SULFATE (2.5 MG/3ML) 0.083% IN NEBU
2.5000 mg | INHALATION_SOLUTION | Freq: Once | RESPIRATORY_TRACT | Status: AC
  Administered 2016-02-23: 2.5 mg via RESPIRATORY_TRACT
  Filled 2016-02-23: qty 3

## 2016-02-23 MED ORDER — HYDROCOD POLST-CPM POLST ER 10-8 MG/5ML PO SUER
5.0000 mL | Freq: Once | ORAL | Status: AC
  Administered 2016-02-23: 5 mL via ORAL
  Filled 2016-02-23: qty 5

## 2016-02-23 MED ORDER — PROMETHAZINE-CODEINE 6.25-10 MG/5ML PO SYRP: 10.0000 mL | ORAL_SOLUTION | ORAL | 0 refills | Status: AC | PRN

## 2016-02-23 MED ORDER — DEXAMETHASONE 4 MG PO TABS
4.0000 mg | ORAL_TABLET | Freq: Two times a day (BID) | ORAL | 0 refills | Status: AC
Start: 2016-02-23 — End: ?

## 2016-02-23 MED ORDER — IBUPROFEN 600 MG PO TABS: 600.0000 mg | ORAL_TABLET | Freq: Four times a day (QID) | ORAL | 0 refills | Status: AC

## 2016-02-23 MED ORDER — DEXAMETHASONE SODIUM PHOSPHATE 4 MG/ML IJ SOLN
8.0000 mg | Freq: Once | INTRAMUSCULAR | Status: AC
  Administered 2016-02-23: 8 mg via INTRAMUSCULAR
  Filled 2016-02-23: qty 2

## 2016-02-23 MED ORDER — ALBUTEROL SULFATE HFA 108 (90 BASE) MCG/ACT IN AERS
2.0000 | INHALATION_SPRAY | Freq: Once | RESPIRATORY_TRACT | Status: AC
  Administered 2016-02-23: 2 via RESPIRATORY_TRACT
  Filled 2016-02-23: qty 6.7

## 2016-02-23 MED ORDER — IPRATROPIUM-ALBUTEROL 0.5-2.5 (3) MG/3ML IN SOLN
3.0000 mL | Freq: Once | RESPIRATORY_TRACT | Status: AC
  Administered 2016-02-23: 3 mL via RESPIRATORY_TRACT
  Filled 2016-02-23: qty 3

## 2016-02-23 NOTE — ED Provider Notes (Signed)
That the pharmacies are closed 12  [facility] Provider Note   CSN: [id number] Arrival date & time: 02/23/16  1624  By signing my name below, I, Christy Sartorius, attest that this documentation has been prepared under the direction and in the presence of  Ivery Quale, PA-C. Electronically Signed: Christy Sartorius, ED Scribe. 02/23/16. 5:27 PM.  History   Chief Complaint Chief Complaint  Patient presents with  . Cough    The history is provided by the patient and medical records. No language interpreter was used.     HPI Comments:  [name] [name] is a 30 y.o. female who presents to the Emergency Department complaining of difficulty breathing and wheezing onset a few weeks ago.  Pt reports that it began with flu like symptoms namely cough and congestion that improved.  She then developed cough, SOB and wheezing.  She describes her pain saying she "feels like she got shot in the back" and adds that she feels like she has a broken rib do to a sharp pain when rotating her torso.  Pt states she feels like she needs to cough something up, but can't and adds that sometimes when she coughs she vomits chunks of blood.  Pt also notes constant generalized abdominal bloating onset 3 days ago.  Normally pt can catch her breath but today she can't.  Her pain is worse with coughing, deep breaths and rotating her torso.  She has tried a number of OTC reliefs including Mucinex without relief.  She has also tried an albuterol inhaler and states she used a friend's albuterol breathing treatment with minimal relief.  Pt denies fever.    Niacin is discussed with her fully. The patient is informed that this drug often have significant side effects that require the dose to be slowly built up to effective levels; many persons cannot continue the drug due to side effects, but it is very effective if the side effects are tolerable. Flushing, itching and feeling hot will likely occur after dosing, especially  with higher doses. This can be eliminated or reduced by taking an aspirin 30 minutes before the niacin.  If these side effects occur and are not tolerable, the drug will be discontinued. She may use either the immediate release 50 mg tabs and up-titrate, or use the timed-release larger doses, both available OTC. The effective dose is between 1000 and 2000 mg per day. Hyperglycemia and abnormal LFT's may occur. Repeat lipids along with LFT's in 3 months.  Past Medical History:  Diagnosis Date  . ADHD (attention deficit hyperactivity disorder)   . Celiac disease   . Complication of anesthesia    pt aspirated when being put to sleep for tonsillectomy in past   . MRSA (methicillin resistant staph aureus) culture positive     Patient Active Problem List   Diagnosis Date Noted  . 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 Cellulitis and abscess 01/31/2014  . Abscess 01/21/2014  . Cellulitis 01/18/2014  . Sepsis (HCC) 01/18/2014    Past Surgical History:  Procedure Laterality Date  . INCISION AND DRAINAGE ABSCESS Left 01/21/2014   Procedure: INCISION AND DRAINAGE LEFT ELBOW ABSCESS;  Surgeon: Vickki Hearing, MD;  Location: [facility];  Service: Orthopedics;  Laterality: Left;  . TONSILLECTOMY  2004    OB History    No data available       Home Medications    Prior to Admission medications   Medication Sig Start Date End Date Taking? Authorizing Provider  amphetamine-dextroamphetamine (ADDERALL) 10 MG tablet Take 10 mg by mouth at bedtime. In afternoon    Historical Provider, MD  amphetamine-dextroamphetamine (ADDERALL) 30 MG tablet Take 30 mg by mouth daily. In AM    Historical Provider, MD  cephALEXin (KEFLEX) 500 MG capsule Take 1 capsule (500 mg total) by mouth 4 (four) times daily. 01/16/15   Eber Hong, MD  docusate sodium 100 MG CAPS Take 100 mg by mouth 2 (two) times daily. Patient not taking: Reported on 01/16/2015 01/22/14   Vassie Loll, MD  fluconazole (DIFLUCAN) 100 MG  tablet Take 1 tablet (100 mg total) by mouth daily. Patient not taking: Reported on 01/16/2015 01/22/14   Vassie Loll, MD  gabapentin (NEURONTIN) 100 MG capsule Take 1 capsule (100 mg total) by mouth 3 (three) times daily. 02/28/14   Vickki Hearing, MD  ondansetron (ZOFRAN) 4 MG tablet Take 1 tablet (4 mg total) by mouth every 6 (six) hours as needed for nausea. Patient not taking: Reported on 01/16/2015 02/28/14   Vickki Hearing, MD  oxyCODONE-acetaminophen (PERCOCET/ROXICET) 5-325 MG per tablet Take 2 tablets by mouth every 4 (four) hours as needed for moderate pain. Patient not taking: Reported on 01/16/2015 01/31/14   Vickki Hearing, MD  sulfamethoxazole-trimethoprim (BACTRIM DS) 800-160 MG per tablet Take 1 tablet by mouth every 12 (twelve) hours. Patient not taking: Reported on 01/16/2015 02/28/14   Vickki Hearing, MD    Family History Family History  Problem Relation Age of Onset  . Stroke Mother   . Cancer - Other Father     Social History Social History  Substance Use Topics  . Smoking status: Never Smoker  . Smokeless tobacco: Never Used  . Alcohol use No     Allergies   Latex; Pineapple; Aloe vera; and Vancomycin   Review of Systems Review of Systems  Respiratory: Positive for cough, shortness of breath and wheezing.   Cardiovascular: Positive for chest pain.  Gastrointestinal: Positive for vomiting.  Musculoskeletal: Positive for back pain.     Physical Exam Updated Vital Signs BP 133/77 (BP Location: Left Arm)   Pulse 90   Temp 98.5 F (36.9 C) (Oral)   Resp (!) 28   Ht 5\' 3"  (1.6 m)   Wt 130 lb (59 kg)   LMP 02/19/2016   SpO2 97%   BMI 23.03 kg/m   Physical Exam  Constitutional: She is oriented to person, place, and time. She appears well-developed and well-nourished. No distress.  HENT:  Head: Normocephalic and atraumatic.  Airway patent uvula midline mucosa is moist.    Eyes: Conjunctivae are normal.  Neck:  Trachea is midline.   Neck is supple.    Cardiovascular: Normal rate, regular rhythm and normal heart sounds.   Pulmonary/Chest: Effort normal.  Pain to palpation of the right flank.  There is symmetrical rise and fall of the chest.  There is tachypnea present bilateral wheezes present.  Few scattered rhonchi.    Abdominal: Soft.  Musculoskeletal:  No edema.  Full ROM of all extremities.    Neurological: She is alert and oriented to person, place, and time.  Skin: Skin is warm and dry. Capillary refill takes less than 2 seconds.  Psychiatric: She has a normal mood and affect.  Nursing note and vitals reviewed.  6:31 PM  No longer tachypnic.  Pt continues to have bilateral wheezes, rhonchi and cough.  Second nebulizer treatment to be ordered.    ED Treatments / Results   DIAGNOSTIC STUDIES:  Oxygen Saturation is 97% on RA, NML by my interpretation.    COORDINATION OF CARE:  5:24 PM Will give breathing treatment and order x-ray of the chest and ribs.  Discussed treatment plan with pt at bedside and pt agreed to plan.   Labs (all labs ordered are listed, but only abnormal results are displayed) Labs Reviewed  CBC WITH DIFFERENTIAL/PLATELET  COMPREHENSIVE METABOLIC PANEL  POC URINE PREG, ED    EKG  EKG Interpretation None       Radiology No results found.  Procedures Procedures (including critical care time)  Medications Ordered in ED Medications  ipratropium-albuterol (DUONEB) 0.5-2.5 (3) MG/3ML nebulizer solution 3 mL (not administered)  albuterol (PROVENTIL) (2.5 MG/3ML) 0.083% nebulizer solution 2.5 mg (not administered)  dexamethasone (DECADRON) injection 8 mg (not administered)  chlorpheniramine-HYDROcodone (TUSSIONEX) 10-8 MG/5ML suspension 5 mL (not administered)     Initial Impression / Assessment and Plan / ED Course  I have reviewed the triage vital signs and the nursing notes.  Pertinent labs & imaging results that were available during my care of the patient were reviewed  by me and considered in my medical decision making (see chart for details).   Wheezing improved.  Pt still has cough with deep breathing.  Plan for discharge home. Pt will receive cough medication.  She is instructed to increase fluids and wash hands frequently.  She has been given an albuterol inhaler and will be given a prescription for decadron.  She is to be rechecked in 3 days or sooner if any changes or problems.    Final Clinical Impressions(s) / ED Diagnoses Competence of metabolic panel is well within normal limits. The d-dimer is negative for acute event. The rib x-ray is negative for fracture, the chest x-ray is negative for acute problem. The patient's white blood cell count is elevated, believed to be related to cough and muscle strain as the patient has been coughing for 1-2 weeks. Discussed instructions as above. Patient acknowledges understanding, and is in agreement.   At discharge, patient reports that her pharmacy is closed. Patient given Tussionex dose to take home, to use at bedtime.    Final diagnoses:  Acute bronchitis, unspecified organism    New Prescriptions New Prescriptions   No medications on file   **I personally performed the services described in this documentation, which was scribed in my presence. The recorded information has been reviewed and is accurate.Ivery Quale*   Wilmont Olund, PA-C 02/23/16 2004    Ivery QualeHobson Kiyo Heal, PA-C 02/23/16 2007    Marily MemosJason Mesner, MD 02/23/16 [phone]

## 2016-02-23 NOTE — Discharge Instructions (Signed)
Please increase fluids. Please use 600 mg of ibuprofen with breakfast, lunch, dinner, and at bedtime. Use 2 puffs of albuterol every 4 hours for the next 3 days. Use Decadron 2 times daily with food until all taken. Promethazine codeine cough medication every 4 hours as needed for cough and congestion. This medication may cause drowsiness, please use with caution. Please see Miss Tiburcio PeaHarris, or return to the emergency department in 3 days for recheck. Please return sooner if any changes, problems, or concerns.

## 2016-02-23 NOTE — ED Triage Notes (Signed)
Pt reports 5-6 days of non productive cough, body aches, headache and emesis with cough.

## 2016-02-28 ENCOUNTER — Encounter (HOSPITAL_COMMUNITY): Payer: Self-pay | Admitting: Emergency Medicine

## 2016-02-28 ENCOUNTER — Emergency Department (HOSPITAL_COMMUNITY): Payer: Self-pay

## 2016-02-28 ENCOUNTER — Emergency Department (HOSPITAL_COMMUNITY)
Admission: EM | Admit: 2016-02-28 | Discharge: 2016-02-28 | Disposition: A | Discharge status: Home / Self Care | Payer: Self-pay | Attending: Emergency Medicine | Admitting: Emergency Medicine

## 2016-02-28 DIAGNOSIS — F909 Attention-deficit hyperactivity disorder, unspecified type: Secondary | ICD-10-CM | POA: Insufficient documentation

## 2016-02-28 DIAGNOSIS — M549 Dorsalgia, unspecified: Secondary | ICD-10-CM | POA: Insufficient documentation

## 2016-02-28 DIAGNOSIS — J189 Pneumonia, unspecified organism: Secondary | ICD-10-CM | POA: Insufficient documentation

## 2016-02-28 LAB — CBC WITH DIFFERENTIAL/PLATELET
BASOS PCT: 0 %
Basophils Absolute: 0 10*3/uL (ref 0.0–0.1)
EOS ABS: 0.1 10*3/uL (ref 0.0–0.7)
EOS PCT: 1 %
HCT: 36.8 % (ref 36.0–46.0)
HEMOGLOBIN: 12 g/dL (ref 12.0–15.0)
Lymphocytes Relative: 18 %
Lymphs Abs: 1.9 10*3/uL (ref 0.7–4.0)
MCH: 30.1 pg (ref 26.0–34.0)
MCHC: 32.6 g/dL (ref 30.0–36.0)
MCV: 92.2 fL (ref 78.0–100.0)
MONOS PCT: 4 %
Monocytes Absolute: 0.4 10*3/uL (ref 0.1–1.0)
NEUTROS PCT: 77 %
Neutro Abs: 7.8 10*3/uL — ABNORMAL HIGH (ref 1.7–7.7)
PLATELETS: 356 10*3/uL (ref 150–400)
RBC: 3.99 MIL/uL (ref 3.87–5.11)
RDW: 13.1 % (ref 11.5–15.5)
WBC: 10.3 10*3/uL (ref 4.0–10.5)

## 2016-02-28 LAB — COMPREHENSIVE METABOLIC PANEL
ALBUMIN: 3.7 g/dL (ref 3.5–5.0)
ALT: 17 U/L (ref 14–54)
AST: 17 U/L (ref 15–41)
Alkaline Phosphatase: 49 U/L (ref 38–126)
Anion gap: 6 (ref 5–15)
BUN: 13 mg/dL (ref 6–20)
CHLORIDE: 108 mmol/L (ref 101–111)
CO2: 25 mmol/L (ref 22–32)
Calcium: 8.9 mg/dL (ref 8.9–10.3)
Creatinine, Ser: 0.62 mg/dL (ref 0.44–1.00)
GFR calc non Af Amer: >60 mL/min (ref >60)
GLUCOSE: 110 mg/dL — AB (ref 65–99)
POTASSIUM: 3.6 mmol/L (ref 3.5–5.1)
SODIUM: 139 mmol/L (ref 135–145)
Total Bilirubin: 0.2 mg/dL — ABNORMAL LOW (ref 0.3–1.2)
Total Protein: 6.3 g/dL — ABNORMAL LOW (ref 6.5–8.1)

## 2016-02-28 MED ORDER — IPRATROPIUM BROMIDE 0.02 % IN SOLN
0.5000 mg | Freq: Once | RESPIRATORY_TRACT | Status: AC
  Administered 2016-02-28: 0.5 mg via RESPIRATORY_TRACT
  Filled 2016-02-28: qty 2.5

## 2016-02-28 MED ORDER — AZITHROMYCIN 250 MG PO TABS: 250.0000 mg | ORAL_TABLET | Freq: Every day | ORAL | 0 refills | Status: DC

## 2016-02-28 MED ORDER — CEPHALEXIN 500 MG PO CAPS: 500.0000 mg | ORAL_CAPSULE | Freq: Two times a day (BID) | ORAL | 0 refills | Status: DC

## 2016-02-28 MED ORDER — ALBUTEROL (5 MG/ML) CONTINUOUS INHALATION SOLN
10.0000 mg/h | INHALATION_SOLUTION | RESPIRATORY_TRACT | Status: DC
  Administered 2016-02-28: 10 mg/h via RESPIRATORY_TRACT
  Filled 2016-02-28: qty 20

## 2016-02-28 MED ORDER — SODIUM CHLORIDE 0.9 % IV BOLUS (SEPSIS)
1000.0000 mL | Freq: Once | INTRAVENOUS | Status: AC
  Administered 2016-02-28: 1000 mL via INTRAVENOUS

## 2016-02-28 MED ORDER — CEPHALEXIN 500 MG PO CAPS
500.0000 mg | ORAL_CAPSULE | Freq: Once | ORAL | Status: AC
  Administered 2016-02-28: 500 mg via ORAL
  Filled 2016-02-28: qty 1

## 2016-02-28 MED ORDER — PREDNISONE 50 MG PO TABS
60.0000 mg | ORAL_TABLET | Freq: Once | ORAL | Status: AC
  Administered 2016-02-28: 60 mg via ORAL
  Filled 2016-02-28: qty 1

## 2016-02-28 MED ORDER — AZITHROMYCIN 250 MG PO TABS
500.0000 mg | ORAL_TABLET | Freq: Once | ORAL | Status: AC
Start: 2016-02-28 — End: 2016-02-28
  Administered 2016-02-28: 500 mg via ORAL
  Filled 2016-02-28: qty 2

## 2016-02-28 NOTE — ED Notes (Signed)
Unable to take temp pt taking breathing treatment

## 2016-02-28 NOTE — ED Notes (Signed)
To radiology via wheelchair

## 2016-02-28 NOTE — ED Provider Notes (Signed)
I saw and evaluated the patient, reviewed the resident's note and I agree with the findings and plan with the following exceptions.   Here with 6 weeks of worsening symptoms. Also with productive cough. Subjective fever/chills.  Exam with diffuse rhonchi, crackles and wheezing. No le edema. No resp distress. Afebrile. Cardiac exam normal.  Will treat for cap. Considered PE but symptoms improved significantly with steroids, bronchodilators. Some symptoms c/w pertussis as well, however azithromycin will cover.  Would consider fungal infection if fails antibiotic course.    Marily MemosJason Syanne Looney, MD 02/28/16 [phone]

## 2016-02-28 NOTE — ED Notes (Signed)
meds held until after breathing treatment

## 2016-02-28 NOTE — ED Provider Notes (Signed)
[facility] Provider Note   CSN: [id number] Arrival date & time: 02/28/16  1734     History   Chief Complaint Chief Complaint  Patient presents with  . Cough    HPI [name] [name] is a [age] y.o. female.  HPI  COUGH Patient endorses upper respiratory symptoms over the past 1 month. She was seen here last week with similar complaints. She states that at that time she was given Decadron and albuterol. Her symptoms have persisted and she states they might be a little worse since that time. She endorses continuous occasionally productive cough. Regular chills and diaphoresis. She denies any fever but states that she "feels like" she has one even though her temperature doesn't read high.  She states that she "feels like I got hit by a truck". She has some chest tenderness with coughing which she believes is due to the fact that she has been coughing so hard.   Has been coughing for 1 month. Cough is: Mostly dry but occasional sputum production Sputum production: Occasional, when present is foul tasting and brown in color Medications tried: Decadron, albuterol, Mucinex, Benadryl, ibuprofen, Tylenol. Taking blood pressure medication: no  Symptoms Runny nose: yes Mucous in back of throat: no Throat burning or reflux: no Wheezing or asthma: yes Fever: no Chest Pain: yes, with coughing Shortness of breath: no Leg swelling: no Hemoptysis: no Weight loss: no  Smoking Status: non-smoker   Past Medical History:  Diagnosis Date  . ADHD (attention deficit hyperactivity disorder)   . Celiac disease   . Complication of anesthesia    pt aspirated when being put to sleep for tonsillectomy in past   . MRSA (methicillin resistant staph aureus) culture positive     Patient Active Problem List   Diagnosis Date Noted  . 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 Cellulitis and abscess 01/31/2014  . Abscess 01/21/2014  . Cellulitis 01/18/2014  . Sepsis (HCC) 01/18/2014     Past Surgical History:  Procedure Laterality Date  . INCISION AND DRAINAGE ABSCESS Left 01/21/2014   Procedure: INCISION AND DRAINAGE LEFT ELBOW ABSCESS;  Surgeon: Vickki HearingStanley E Harrison, MD;  Location: [facility];  Service: Orthopedics;  Laterality: Left;  . TONSILLECTOMY  2004    OB History    No data available       Home Medications    Prior to Admission medications   Medication Sig Start Date End Date Taking? Authorizing Provider  amphetamine-dextroamphetamine (ADDERALL) 10 MG tablet Take 10 mg by mouth at bedtime. In afternoon    Historical Provider, MD  amphetamine-dextroamphetamine (ADDERALL) 30 MG tablet Take 30 mg by mouth daily. In AM    Historical Provider, MD  azithromycin (ZITHROMAX) 250 MG tablet Take 1 tablet (250 mg total) by mouth daily. 02/28/16   Kathee DeltonIan D Makenleigh Crownover, MD  cephALEXin (KEFLEX) 500 MG capsule Take 1 capsule (500 mg total) by mouth 2 (two) times daily. 02/28/16   Kathee DeltonIan D Scottlyn Mchaney, MD  dexamethasone (DECADRON) 4 MG tablet Take 1 tablet (4 mg total) by mouth 2 (two) times daily with a meal. 02/23/16   Ivery QualeHobson Bryant, PA-C  docusate sodium 100 MG CAPS Take 100 mg by mouth 2 (two) times daily. Patient not taking: Reported on 01/16/2015 01/22/14   Vassie Lollarlos Madera, MD  fluconazole (DIFLUCAN) 100 MG tablet Take 1 tablet (100 mg total) by mouth daily. Patient not taking: Reported on 01/16/2015 01/22/14   Vassie Lollarlos Madera, MD  gabapentin (NEURONTIN) 100 MG capsule Take 1 capsule (100 mg  total) by mouth 3 (three) times daily. 02/28/14   Vickki Hearing, MD  ibuprofen (ADVIL,MOTRIN) 600 MG tablet Take 1 tablet (600 mg total) by mouth 4 (four) times daily. 02/23/16   Ivery Quale, PA-C  ondansetron (ZOFRAN) 4 MG tablet Take 1 tablet (4 mg total) by mouth every 6 (six) hours as needed for nausea. Patient not taking: Reported on 01/16/2015 02/28/14   Vickki Hearing, MD  oxyCODONE-acetaminophen (PERCOCET/ROXICET) 5-325 MG per tablet Take 2 tablets by mouth every 4 (four) hours as needed for  moderate pain. Patient not taking: Reported on 01/16/2015 01/31/14   Vickki Hearing, MD  promethazine-codeine [facility] WITH CODEINE) 6.25-10 MG/5ML syrup Take 10 mLs by mouth every 4 (four) hours as needed. 02/23/16   Ivery Quale, PA-C  sulfamethoxazole-trimethoprim (BACTRIM DS) 800-160 MG per tablet Take 1 tablet by mouth every 12 (twelve) hours. Patient not taking: Reported on 01/16/2015 02/28/14   Vickki Hearing, MD    Family History Family History  Problem Relation Age of Onset  . Stroke Mother   . Cancer - Other Father     Social History Social History  Substance Use Topics  . Smoking status: Never Smoker  . Smokeless tobacco: Never Used  . Alcohol use No     Allergies   Latex; Pineapple; Aloe vera; and Vancomycin   Review of Systems Review of Systems  Constitutional: Positive for chills, diaphoresis and fatigue. Negative for activity change, appetite change and fever.  HENT: Positive for postnasal drip, rhinorrhea and sinus pressure. Negative for congestion, drooling, ear discharge, ear pain, mouth sores, sore throat, tinnitus and trouble swallowing.   Eyes: Negative for photophobia, redness and visual disturbance.  Respiratory: Positive for cough, chest tightness, shortness of breath and wheezing.   Cardiovascular: Negative for chest pain and leg swelling.  Gastrointestinal: Negative for abdominal pain, diarrhea, nausea and vomiting.  Endocrine: Positive for polydipsia.  Genitourinary: Negative for dysuria, flank pain, hematuria, pelvic pain and vaginal discharge.  Musculoskeletal: Positive for arthralgias, back pain and myalgias. Negative for joint swelling, neck pain and neck stiffness.  Skin: Negative for pallor and rash.  Neurological: Negative for dizziness, seizures, syncope, facial asymmetry, weakness, light-headedness and headaches.  Hematological: Positive for adenopathy.  Psychiatric/Behavioral: Positive for sleep disturbance. Negative for agitation,  behavioral problems and confusion.     Physical Exam Updated Vital Signs BP 104/70 (BP Location: Left Arm)   Pulse 79   Temp 97.6 F (36.4 C) (Oral)   Resp 18   Ht 5\' 3"  (1.6 m)   Wt 59 kg   LMP 02/19/2016   SpO2 100%   BMI 23.03 kg/m   Physical Exam  Constitutional: She is oriented to person, place, and time. She appears well-developed and well-nourished. No distress.  HENT:  Head: Normocephalic and atraumatic.  Right Ear: External ear normal.  Left Ear: External ear normal.  Mouth/Throat: Posterior oropharyngeal erythema present. No oropharyngeal exudate. No tonsillar exudate.  Cobblestoning present.  Eyes: Conjunctivae and EOM are normal. Pupils are equal, round, and reactive to light.  Neck: Normal range of motion. Neck supple.  Cardiovascular: Regular rhythm, normal heart sounds and intact distal pulses.  Tachycardia present.   No murmur heard. Pulmonary/Chest: Effort normal. No respiratory distress. She has wheezes. She exhibits tenderness.  Abdominal: Soft. Bowel sounds are normal. She exhibits no distension. There is no tenderness. There is no guarding.  Musculoskeletal: Normal range of motion. She exhibits tenderness. She exhibits no edema or deformity.  Neurological: She is alert  and oriented to person, place, and time. No cranial nerve deficit.  Skin: Skin is warm. Capillary refill takes 2 to 3 seconds. No rash noted. She is diaphoretic.  Psychiatric: She has a normal mood and affect. Her behavior is normal. Thought content normal.     ED Treatments / Results  Labs (all labs ordered are listed, but only abnormal results are displayed) Labs Reviewed  CBC WITH DIFFERENTIAL/PLATELET - Abnormal; Notable for the following:       Result Value   Neutro Abs 7.8 (*)    All other components within normal limits  COMPREHENSIVE METABOLIC PANEL - Abnormal; Notable for the following:    Glucose, Bld 110 (*)    Total Protein 6.3 (*)    Total Bilirubin 0.2 (*)    All  other components within normal limits    EKG  EKG Interpretation None       Radiology Dg Chest 2 View  Result Date: 02/28/2016 CLINICAL DATA:  Productive cough, chest congestion EXAM: CHEST  2 VIEW COMPARISON:  02/23/2016 FINDINGS: The heart size and mediastinal contours are within normal limits. Both lungs are clear. The visualized skeletal structures are unremarkable. IMPRESSION: No active cardiopulmonary disease. Electronically Signed   By: Natasha Mead M.D.   On: 02/28/2016 19:39    Procedures Procedures (including critical care time)  Medications Ordered in ED Medications  albuterol (PROVENTIL,VENTOLIN) solution continuous neb (10 mg/hr Nebulization New Bag/Given 02/28/16 1932)  ipratropium (ATROVENT) nebulizer solution 0.5 mg (0.5 mg Nebulization Given 02/28/16 1932)  predniSONE (DELTASONE) tablet 60 mg (60 mg Oral Given 02/28/16 1945)  azithromycin (ZITHROMAX) tablet 500 mg (500 mg Oral Given 02/28/16 1944)  cephALEXin (KEFLEX) capsule 500 mg (500 mg Oral Given 02/28/16 1946)  sodium chloride 0.9 % bolus 1,000 mL (0 mLs Intravenous Stopped 02/28/16 2017)     Initial Impression / Assessment and Plan / ED Course  I have reviewed the triage vital signs and the nursing notes.  Pertinent labs & imaging results that were available during my care of the patient were reviewed by me and considered in my medical decision making (see chart for details).  Clinical Course   Cough, suspect Community acquired pneumonia: Patient is endorsing a month of persistent cough and malaise. Symptoms have been persistent despite treatment initiated last week with albuterol and Decadron. Patient history significant for second sickening. Initiating antibiotic treatment at this time with azithromycin and Keflex. Encouraged ibuprofen and Tylenol for body aches and fevers/chills. Encouraged adequate hydration.   Next: Patient informed follow-up with PCP in one week. 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   Final Clinical Impressions(s) / ED Diagnoses   Final diagnoses:  Community acquired pneumonia, unspecified laterality    New Prescriptions New Prescriptions   AZITHROMYCIN (ZITHROMAX) 250 MG TABLET    Take 1 tablet (250 mg total) by mouth daily.   CEPHALEXIN (KEFLEX) 500 MG CAPSULE    Take 1 capsule (500 mg total) by mouth 2 (two) times daily.     Kathee Delton, MD 02/28/16 4540    Marily Memos, MD 02/28/16 [phone]

## 2016-02-28 NOTE — ED Notes (Signed)
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 She is a non smoker

## 2016-02-28 NOTE — ED Triage Notes (Signed)
Reports persistent chest congestion and productive cough. States she was seen here on Monday for same complaint without any relief. Pt HR 110. Pt also reports fever and chills.

## 2016-02-28 NOTE — ED Notes (Signed)
Dr Mesner in to assess 

## 2016-03-21 ENCOUNTER — Encounter (HOSPITAL_COMMUNITY): Payer: Self-pay

## 2016-03-21 ENCOUNTER — Emergency Department (HOSPITAL_COMMUNITY): Payer: Medicaid - Out of State

## 2016-03-21 ENCOUNTER — Emergency Department (HOSPITAL_COMMUNITY)
Admission: EM | Admit: 2016-03-21 | Discharge: 2016-03-21 | Disposition: A | Discharge status: Home / Self Care | Payer: Medicaid - Out of State | Attending: Emergency Medicine | Admitting: Emergency Medicine

## 2016-03-21 DIAGNOSIS — M7918 Myalgia, other site: Secondary | ICD-10-CM

## 2016-03-21 DIAGNOSIS — F909 Attention-deficit hyperactivity disorder, unspecified type: Secondary | ICD-10-CM | POA: Insufficient documentation

## 2016-03-21 DIAGNOSIS — J9801 Acute bronchospasm: Secondary | ICD-10-CM | POA: Insufficient documentation

## 2016-03-21 DIAGNOSIS — J189 Pneumonia, unspecified organism: Secondary | ICD-10-CM

## 2016-03-21 HISTORY — DX: Pneumonia, unspecified organism: J18.9

## 2016-03-21 LAB — BASIC METABOLIC PANEL
Anion gap: 5 (ref 5–15)
BUN: 15 mg/dL (ref 6–20)
CALCIUM: 9.7 mg/dL (ref 8.9–10.3)
CO2: 27 mmol/L (ref 22–32)
CREATININE: 0.74 mg/dL (ref 0.44–1.00)
Chloride: 109 mmol/L (ref 101–111)
Glucose, Bld: 109 mg/dL — ABNORMAL HIGH (ref 65–99)
Potassium: 3.7 mmol/L (ref 3.5–5.1)
SODIUM: 141 mmol/L (ref 135–145)

## 2016-03-21 LAB — CBC WITH DIFFERENTIAL/PLATELET
BASOS ABS: 0.1 10*3/uL (ref 0.0–0.1)
BASOS PCT: 1 %
EOS ABS: 1.4 10*3/uL — AB (ref 0.0–0.7)
EOS PCT: 10 %
HCT: 40.7 % (ref 36.0–46.0)
Hemoglobin: 13.3 g/dL (ref 12.0–15.0)
LYMPHS ABS: 3.2 10*3/uL (ref 0.7–4.0)
Lymphocytes Relative: 23 %
MCH: 30.2 pg (ref 26.0–34.0)
MCHC: 32.7 g/dL (ref 30.0–36.0)
MCV: 92.3 fL (ref 78.0–100.0)
Monocytes Absolute: 0.7 10*3/uL (ref 0.1–1.0)
Monocytes Relative: 5 %
Neutro Abs: 8.3 10*3/uL — ABNORMAL HIGH (ref 1.7–7.7)
Neutrophils Relative %: 61 %
PLATELETS: 402 10*3/uL — AB (ref 150–400)
RBC: 4.41 MIL/uL (ref 3.87–5.11)
RDW: 13.1 % (ref 11.5–15.5)
WBC: 13.7 10*3/uL — AB (ref 4.0–10.5)

## 2016-03-21 LAB — I-STAT BETA HCG BLOOD, ED (MC, WL, AP ONLY)

## 2016-03-21 MED ORDER — ALBUTEROL SULFATE HFA 108 (90 BASE) MCG/ACT IN AERS: 2.0000 | INHALATION_SPRAY | RESPIRATORY_TRACT | 0 refills | Status: AC | PRN

## 2016-03-21 MED ORDER — LEVOFLOXACIN 750 MG PO TABS: 750.0000 mg | ORAL_TABLET | Freq: Every day | ORAL | 0 refills | Status: DC

## 2016-03-21 MED ORDER — PREDNISONE 20 MG PO TABS: 40.0000 mg | ORAL_TABLET | Freq: Every day | ORAL | 0 refills | Status: AC

## 2016-03-21 MED ORDER — IOPAMIDOL (ISOVUE-370) INJECTION 76%
100.0000 mL | Freq: Once | INTRAVENOUS | Status: AC | PRN
  Administered 2016-03-21: 100 mL via INTRAVENOUS

## 2016-03-21 MED ORDER — BENZONATATE 100 MG PO CAPS
100.0000 mg | ORAL_CAPSULE | Freq: Once | ORAL | Status: AC
  Administered 2016-03-21: 100 mg via ORAL
  Filled 2016-03-21: qty 1

## 2016-03-21 MED ORDER — BENZONATATE 100 MG PO CAPS: 100.0000 mg | ORAL_CAPSULE | Freq: Three times a day (TID) | ORAL | 0 refills | Status: AC | PRN

## 2016-03-21 MED ORDER — METHYLPREDNISOLONE SODIUM SUCC 125 MG IJ SOLR
125.0000 mg | Freq: Once | INTRAMUSCULAR | Status: AC
  Administered 2016-03-21: 125 mg via INTRAVENOUS
  Filled 2016-03-21: qty 2

## 2016-03-21 MED ORDER — ALBUTEROL (5 MG/ML) CONTINUOUS INHALATION SOLN
10.0000 mg/h | INHALATION_SOLUTION | Freq: Once | RESPIRATORY_TRACT | Status: AC
  Administered 2016-03-21: 10 mg/h via RESPIRATORY_TRACT

## 2016-03-21 MED ORDER — ALBUTEROL SULFATE (2.5 MG/3ML) 0.083% IN NEBU: 2.5000 mg | INHALATION_SOLUTION | RESPIRATORY_TRACT | 0 refills | Status: AC | PRN

## 2016-03-21 MED ORDER — IPRATROPIUM BROMIDE 0.02 % IN SOLN
1.0000 mg | Freq: Once | RESPIRATORY_TRACT | Status: AC
  Administered 2016-03-21: 1 mg via RESPIRATORY_TRACT
  Filled 2016-03-21: qty 5

## 2016-03-21 MED ORDER — HYDROCODONE-ACETAMINOPHEN 5-325 MG PO TABS: ORAL_TABLET | ORAL | 0 refills | Status: AC

## 2016-03-21 MED ORDER — METHOCARBAMOL 500 MG PO TABS: 1000.0000 mg | ORAL_TABLET | Freq: Four times a day (QID) | ORAL | 0 refills | Status: AC | PRN

## 2016-03-21 NOTE — ED Provider Notes (Signed)
[facility] Provider Note   CSN: [id number] Arrival date & time: 03/21/16  1948     History   Chief Complaint Chief Complaint  Patient presents with  . Shortness of Breath    HPI [name] [name] is a 30 y.o. female.   Shortness of Breath     Pt was seen at 2005.  Per pt, c/o gradual onset and worsening of persistent cough for the past 2+ months. Pt states she was coughing and "felt something snap in my back" this evening PTA. Has been associated with SOB and wheezing.  Denies CP/palpitations, no back pain, no abd pain, no N/V/D, no fevers, no rash. The symptoms have been associated with no other complaints. The patient has a significant history of similar symptoms previously, recently being evaluated for this complaint and multiple prior evals for same. Pt states she saw her PMD after her last ED visit last month, and was told to finish her antibiotics for presumed pneumonia, and was rx a MDI and neb machine. Pt has not returned to her PMD for f/u since.    Past Medical History:  Diagnosis Date  . ADHD (attention deficit hyperactivity disorder)   . Celiac disease   . Complication of anesthesia    pt aspirated when being put to sleep for tonsillectomy in past   . MRSA (methicillin resistant staph aureus) culture positive   . Pneumonia     Patient Active Problem List   Diagnosis Date Noted  . 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 Cellulitis and abscess 01/31/2014  . Abscess 01/21/2014  . Cellulitis 01/18/2014  . Sepsis (HCC) 01/18/2014    Past Surgical History:  Procedure Laterality Date  . INCISION AND DRAINAGE ABSCESS Left 01/21/2014   Procedure: INCISION AND DRAINAGE LEFT ELBOW ABSCESS;  Surgeon: Vickki HearingStanley E Harrison, MD;  Location: [facility];  Service: Orthopedics;  Laterality: Left;  . TONSILLECTOMY  2004    OB History    No data available       Home Medications    Prior to Admission medications   Medication Sig Start Date End Date Taking?  Authorizing Provider  amphetamine-dextroamphetamine (ADDERALL) 10 MG tablet Take 10 mg by mouth at bedtime. In afternoon    Historical Provider, MD  amphetamine-dextroamphetamine (ADDERALL) 30 MG tablet Take 30 mg by mouth daily. In AM    Historical Provider, MD  azithromycin (ZITHROMAX) 250 MG tablet Take 1 tablet (250 mg total) by mouth daily. 02/28/16   Kathee DeltonIan D McKeag, MD  cephALEXin (KEFLEX) 500 MG capsule Take 1 capsule (500 mg total) by mouth 2 (two) times daily. 02/28/16   Kathee DeltonIan D McKeag, MD  dexamethasone (DECADRON) 4 MG tablet Take 1 tablet (4 mg total) by mouth 2 (two) times daily with a meal. 02/23/16   Ivery QualeHobson Bryant, PA-C  docusate sodium 100 MG CAPS Take 100 mg by mouth 2 (two) times daily. Patient not taking: Reported on 01/16/2015 01/22/14   Vassie Lollarlos Madera, MD  fluconazole (DIFLUCAN) 100 MG tablet Take 1 tablet (100 mg total) by mouth daily. Patient not taking: Reported on 01/16/2015 01/22/14   Vassie Lollarlos Madera, MD  gabapentin (NEURONTIN) 100 MG capsule Take 1 capsule (100 mg total) by mouth 3 (three) times daily. 02/28/14   Vickki HearingStanley E Harrison, MD  ibuprofen (ADVIL,MOTRIN) 600 MG tablet Take 1 tablet (600 mg total) by mouth 4 (four) times daily. 02/23/16   Ivery QualeHobson Bryant, PA-C  ondansetron (ZOFRAN) 4 MG tablet Take 1 tablet (4 mg total) by mouth every 6 (  six) hours as needed for nausea. Patient not taking: Reported on 01/16/2015 02/28/14   Vickki HearingStanley E Harrison, MD  oxyCODONE-acetaminophen (PERCOCET/ROXICET) 5-325 MG per tablet Take 2 tablets by mouth every 4 (four) hours as needed for moderate pain. Patient not taking: Reported on 01/16/2015 01/31/14   Vickki HearingStanley E Harrison, MD  promethazine-codeine [facility](PHENERGAN WITH CODEINE) 6.25-10 MG/5ML syrup Take 10 mLs by mouth every 4 (four) hours as needed. 02/23/16   Ivery QualeHobson Bryant, PA-C  sulfamethoxazole-trimethoprim (BACTRIM DS) 800-160 MG per tablet Take 1 tablet by mouth every 12 (twelve) hours. Patient not taking: Reported on 01/16/2015 02/28/14   Vickki HearingStanley E  Harrison, MD    Family History Family History  Problem Relation Age of Onset  . Stroke Mother   . Cancer - Other Father     Social History Social History  Substance Use Topics  . Smoking status: Never Smoker  . Smokeless tobacco: Never Used  . Alcohol use No     Allergies   Latex; Pineapple; Aloe vera; and Vancomycin   Review of Systems Review of Systems  Respiratory: Positive for shortness of breath.   ROS: Statement: All systems negative except as marked or noted in the HPI; Constitutional: Negative for fever and chills. ; ; Eyes: Negative for eye pain, redness and discharge. ; ; ENMT: Negative for ear pain, hoarseness, nasal congestion, sinus pressure and sore throat. ; ; Cardiovascular: Negative for chest pain, palpitations, diaphoresis, and peripheral edema. ; ; Respiratory: +cough, wheezing, SOB. Negative for stridor. ; ; Gastrointestinal: Negative for nausea, vomiting, diarrhea, abdominal pain, blood in stool, hematemesis, jaundice and rectal bleeding. . ; ; Genitourinary: Negative for dysuria, flank pain and hematuria. ; ; Musculoskeletal: +back pain. Negative for neck pain. Negative for swelling and trauma.; ; Skin: Negative for pruritus, rash, abrasions, blisters, bruising and skin lesion.; ; Neuro: Negative for headache, lightheadedness and neck stiffness. Negative for weakness, altered level of consciousness, altered mental status, extremity weakness, paresthesias, involuntary movement, seizure and syncope.      Physical Exam Updated Vital Signs BP 156/93   Pulse (!) 129   Temp 98.2 F (36.8 C) (Oral)   Resp 26   Ht 5\' 3"  (1.6 m)   Wt 130 lb (59 kg)   LMP 03/16/2016 (Exact Date)   SpO2 98%   BMI 23.03 kg/m  BP 121/78 (BP Location: Right Arm)   Pulse 101   Temp 98.2 F (36.8 C) (Oral)   Resp 20   Ht 5\' 3"  (1.6 m)   Wt 130 lb (59 kg)   LMP 03/16/2016 (Exact Date)   SpO2 95%   BMI 23.03 kg/m    Physical Exam 2010: Physical examination:  Nursing notes  reviewed; Vital signs and O2 SAT reviewed;  Constitutional: Well developed, Well nourished, Well hydrated, Uncomfortable appearing.; Head:  Normocephalic, atraumatic; Eyes: EOMI, PERRL, No scleral icterus; ENMT: Mouth and pharynx normal, Mucous membranes moist; Neck: Supple, Full range of motion, No lymphadenopathy; Cardiovascular: Tachycardic rate and rhythm, No gallop; Respiratory: Breath sounds coarse & equal bilaterally, scattered wheezes.  Speaking short sentences. Sitting upright, tachypneic.; Chest: Nontender, Movement normal; Abdomen: Soft, Nontender, Nondistended, Normal bowel sounds; Genitourinary: No CVA tenderness; Spine:  No midline CS, TS, LS tenderness. +TTP right posterior ribs.;;;; Extremities: Pulses normal, No tenderness, No edema, No calf edema or asymmetry.; Neuro: AA&Ox3, Major CN grossly intact.  Speech clear. No gross focal motor or sensory deficits in extremities.; Skin: Color normal, Warm, Dry.   ED Treatments / Results  Labs (all labs ordered  are listed, but only abnormal results are displayed)   EKG  EKG Interpretation None       Radiology   Procedures Procedures (including critical care time)  Medications Ordered in ED Medications  methylPREDNISolone sodium succinate (SOLU-MEDROL) 125 mg/2 mL injection 125 mg (not administered)  albuterol (PROVENTIL,VENTOLIN) solution continuous neb (not administered)  ipratropium (ATROVENT) nebulizer solution 1 mg (not administered)     Initial Impression / Assessment and Plan / ED Course  I have reviewed the triage vital signs and the nursing notes.  Pertinent labs & imaging results that were available during my care of the patient were reviewed by me and considered in my medical decision making (see chart for details).  MDM Reviewed: previous chart, nursing note and vitals Reviewed previous: labs and x-ray Interpretation: labs and CT scan    Results for orders placed or performed during the hospital encounter  of 03/21/16  Basic metabolic panel  Result Value Ref Range   Sodium 141 135 - 145 mmol/L   Potassium 3.7 3.5 - 5.1 mmol/L   Chloride 109 101 - 111 mmol/L   CO2 27 22 - 32 mmol/L   Glucose, Bld 109 (H) 65 - 99 mg/dL   BUN 15 6 - 20 mg/dL   Creatinine, Ser 1.61 0.44 - 1.00 mg/dL   Calcium 9.7 8.9 - 09.6 mg/dL   GFR calc non Af Amer >60 >60 mL/min   GFR calc Af Amer >60 >60 mL/min   Anion gap 5 5 - 15  CBC with Differential  Result Value Ref Range   WBC 13.7 (H) 4.0 - 10.5 K/uL   RBC 4.41 3.87 - 5.11 MIL/uL   Hemoglobin 13.3 12.0 - 15.0 g/dL   HCT 04.5 40.9 - 81.1 %   MCV 92.3 78.0 - 100.0 fL   MCH 30.2 26.0 - 34.0 pg   MCHC 32.7 30.0 - 36.0 g/dL   RDW 91.4 78.2 - 95.6 %   Platelets 402 (H) 150 - 400 K/uL   Neutrophils Relative % 61 %   Neutro Abs 8.3 (H) 1.7 - 7.7 K/uL   Lymphocytes Relative 23 %   Lymphs Abs 3.2 0.7 - 4.0 K/uL   Monocytes Relative 5 %   Monocytes Absolute 0.7 0.1 - 1.0 K/uL   Eosinophils Relative 10 %   Eosinophils Absolute 1.4 (H) 0.0 - 0.7 K/uL   Basophils Relative 1 %   Basophils Absolute 0.1 0.0 - 0.1 K/uL  I-Stat beta hCG blood, ED  Result Value Ref Range   I-stat hCG, quantitative <5.0 <5 mIU/mL   Comment 3           Ct Angio Chest Pe W/cm &/or Wo Cm Result Date: 03/21/2016 CLINICAL DATA:  Chronic cough and shortness of breath. Felt pop and pain in back. Initial encounter. EXAM: CT ANGIOGRAPHY CHEST WITH CONTRAST TECHNIQUE: Multidetector CT imaging of the chest was performed using the standard protocol during bolus administration of intravenous contrast. Multiplanar CT image reconstructions and MIPs were obtained to evaluate the vascular anatomy. CONTRAST:  100 mL of Isovue 370 IV contrast COMPARISON:  None. FINDINGS: Cardiovascular:  There is no evidence of pulmonary embolus. The heart is unremarkable in appearance. The great vessels are unremarkable in appearance. No calcific atherosclerotic disease is seen. Mediastinum/Nodes: The mediastinum is  unremarkable in appearance. No mediastinal lymphadenopathy is seen. No pericardial effusion is identified. The thyroid gland is unremarkable. No axillary lymphadenopathy is appreciated. Lungs/Pleura: Vague small bilateral peripheral ground-glass opacities may reflect pneumonia, though small  septic emboli might have a similar appearance. No pleural effusion or pneumothorax is seen. No dominant masses are identified. Upper Abdomen: The visualized portions of the liver and spleen are unremarkable. The visualized portions of the pancreas, adrenal glands and kidneys are within normal limits. Musculoskeletal: No acute osseous abnormalities are identified. The visualized musculature is unremarkable in appearance. Review of the MIP images confirms the above findings. IMPRESSION: 1. No evidence of pulmonary embolus. 2. Vague small bilateral peripheral ground-glass opacities may reflect pneumonia, though small septic emboli might have a similar appearance. Electronically Signed   By: Roanna Raider M.D.   On: 03/21/2016 22:39     1040:  Doubt septic emboli, as pt has normal WBC count, is afebrile, without CP, no hx risky behaviors (IVDU). Pt states she "feels better" after neb and steroid.  NAD, lungs CTA bilat, no wheezing, resps easy, speaking full sentences, Sats 98% R/A.  Pt ambulated around the ED with Sats remaining 95 % R/A, resps easy, NAD.  Wants to go home now. Tx symptomatically, f/u Pulmonary MD. Dx and testing d/w pt and family.  Questions answered.  Verb understanding, agreeable to d/c home with outpt f/u.     Final Clinical Impressions(s) / ED Diagnoses   Final diagnoses:  None    New Prescriptions New Prescriptions   No medications on file      Samuel Jester, DO 03/24/16 1053

## 2016-03-21 NOTE — ED Notes (Signed)
Pulse ox 95% while ambulating 

## 2016-03-21 NOTE — Discharge Instructions (Signed)
Take the prescriptions as directed.  Apply moist heat or ice to the area(s) of discomfort, for 15 minutes at a time, several times per day for the next few days.  Do not fall asleep on a heating or ice pack.  Use your albuterol inhaler (2 to 4 puffs) or your albuterol nebulizer (1 unit dose) every 4 hours for the next 7 days, then as needed for cough, wheezing, or shortness of breath.  Call your regular medical doctor tomorrow morning to schedule a follow up appointment within the next 2 to 3 days.  Call the Pulmonologist tomorrow morning to schedule a follow up appointment within the next week. Return to the Emergency Department immediately sooner if worsening.

## 2016-03-21 NOTE — ED Triage Notes (Signed)
Trouble breathing.  I have had a cough for two months.  I have been treated for pneumonia.  I was sitting on the couch and I was coughing and it felt like something in my back snapped like a twig and I have been hurting ever since.  Breathing worsened after feeling the pop.

## 2016-04-01 ENCOUNTER — Other Ambulatory Visit (HOSPITAL_COMMUNITY): Payer: Self-pay | Admitting: Family Medicine

## 2016-09-05 IMAGING — US US RENAL
1 series · 14 of 25 positions shown · non-contrast
Comparison: None.

CLINICAL DATA: 28-year-old female with pyelonephritis.

EXAM:
RENAL/URINARY TRACT ULTRASOUND COMPLETE

[Series 1: us renal · 0.21mm/px · 14 of 27 slices shown]
[im 1/27]
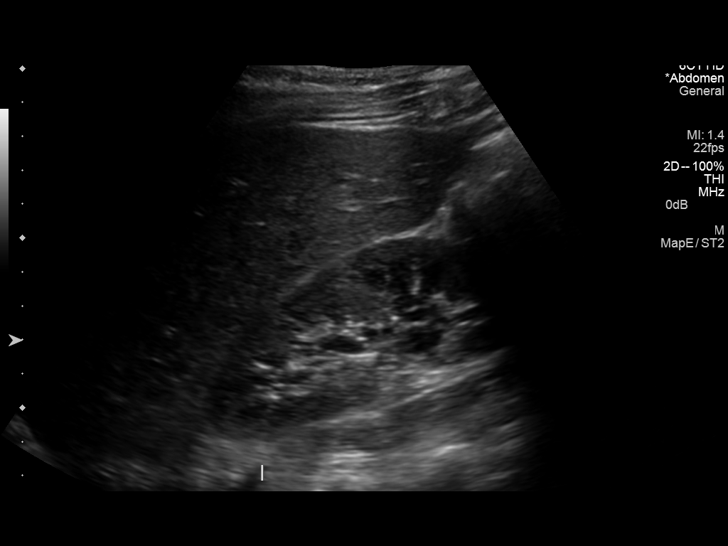
[im 3/27]
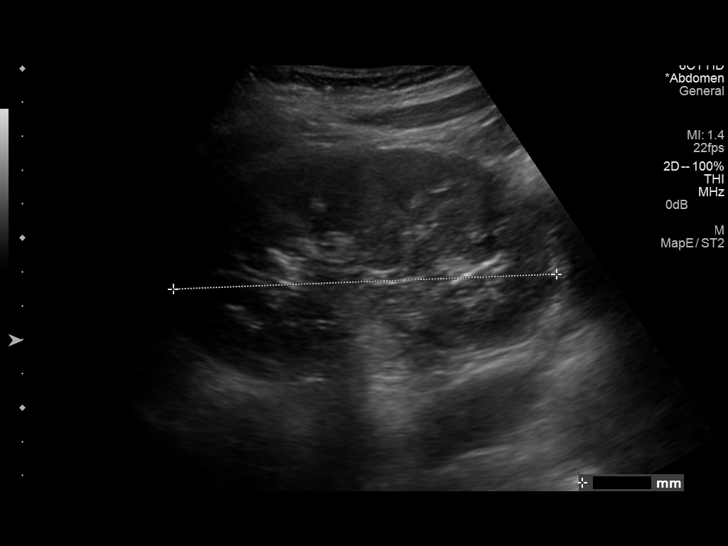
[im 5/27]
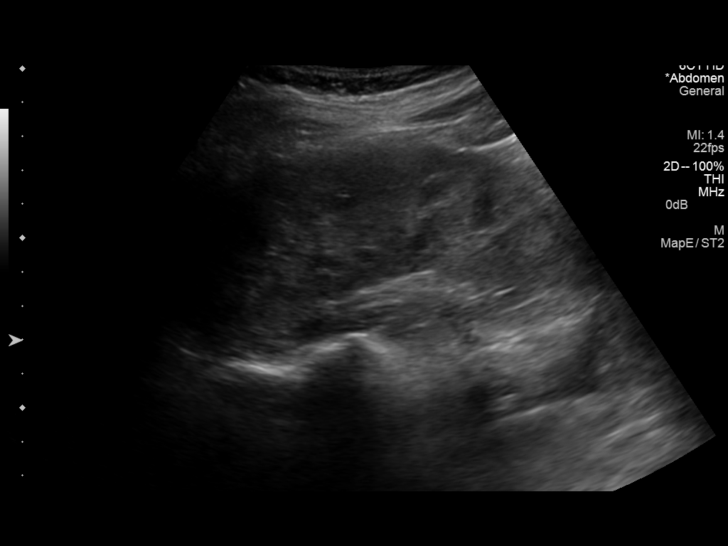
[im 7/27]
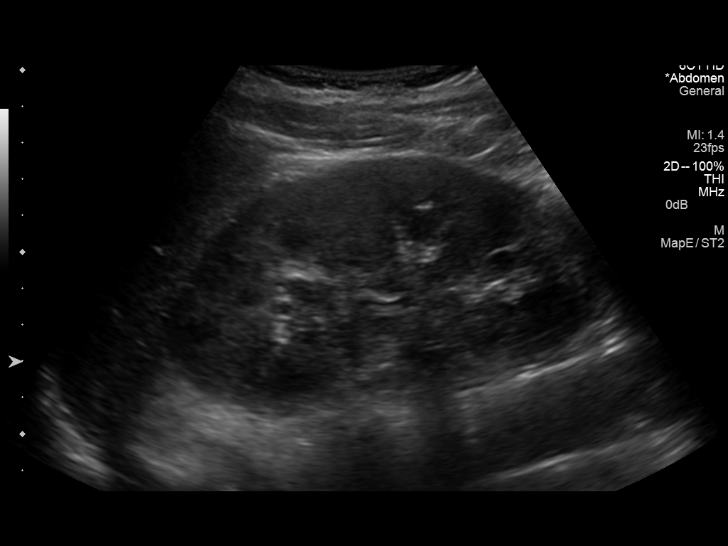
[im 9/27]
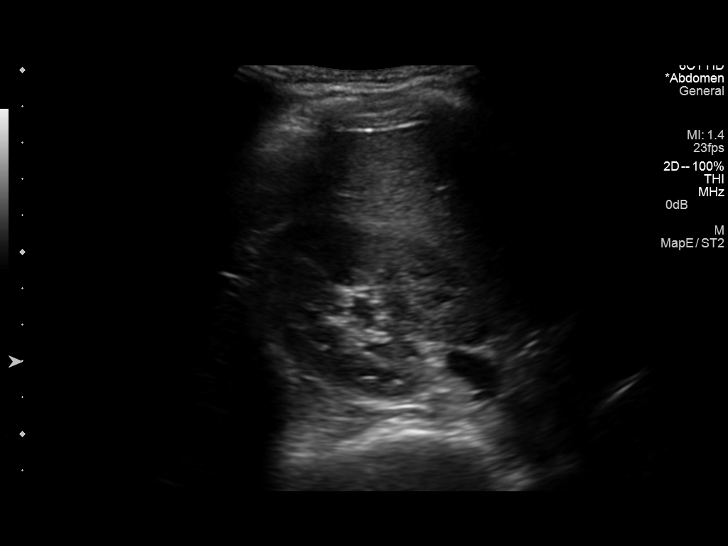
[im 10/27]
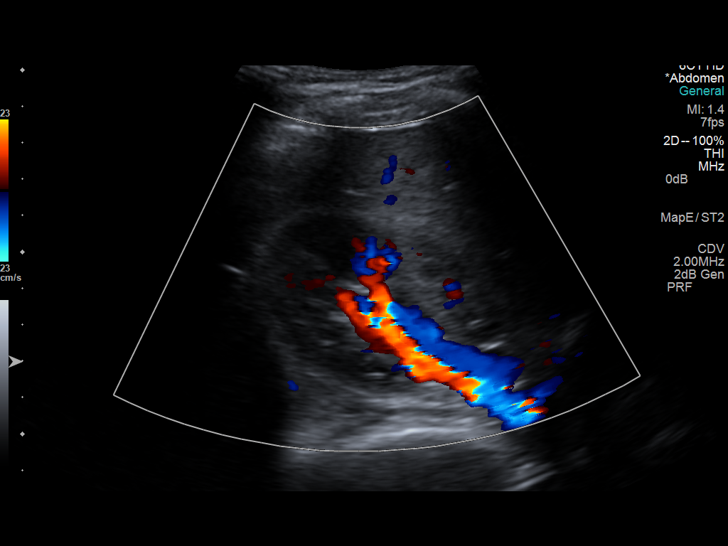
[im 12/27]
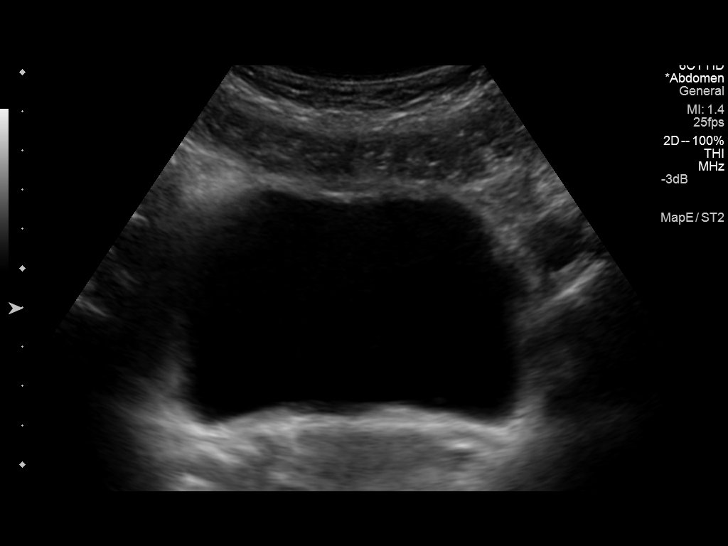
[im 15/27]
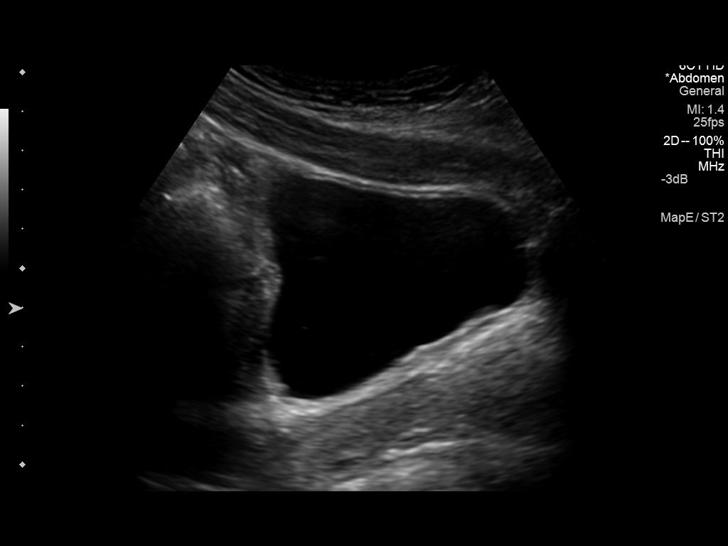
[im 17/27]
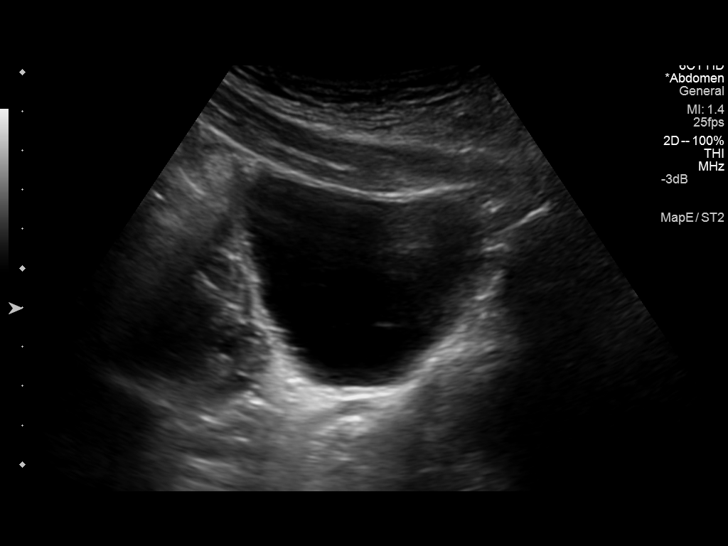
[im 18/27]
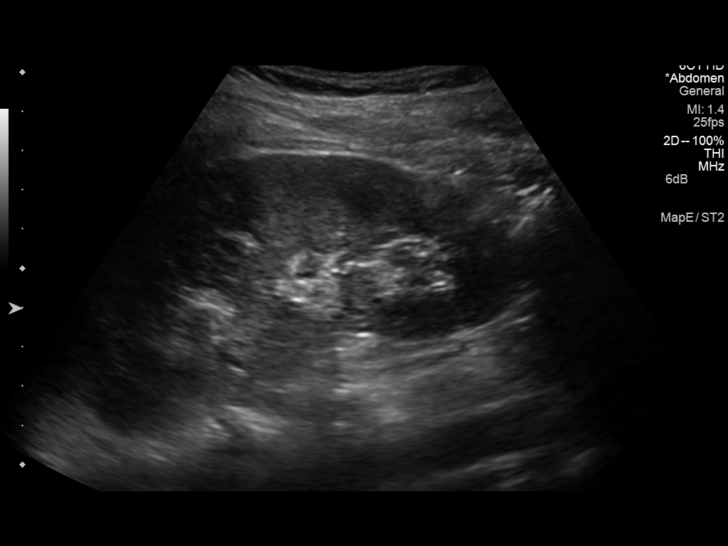
[im 20/27]
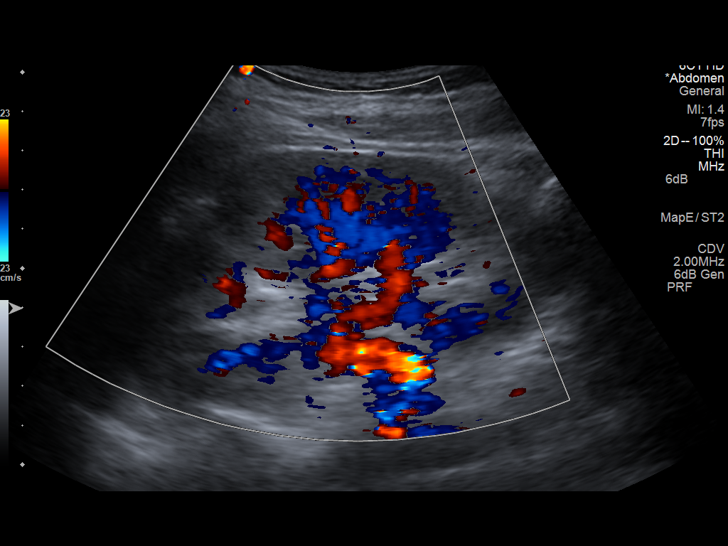
[im 22/27]
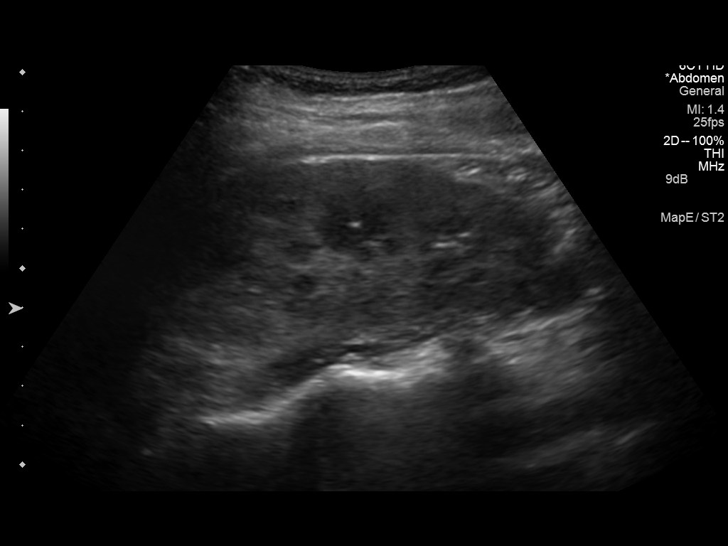
[im 24/27]
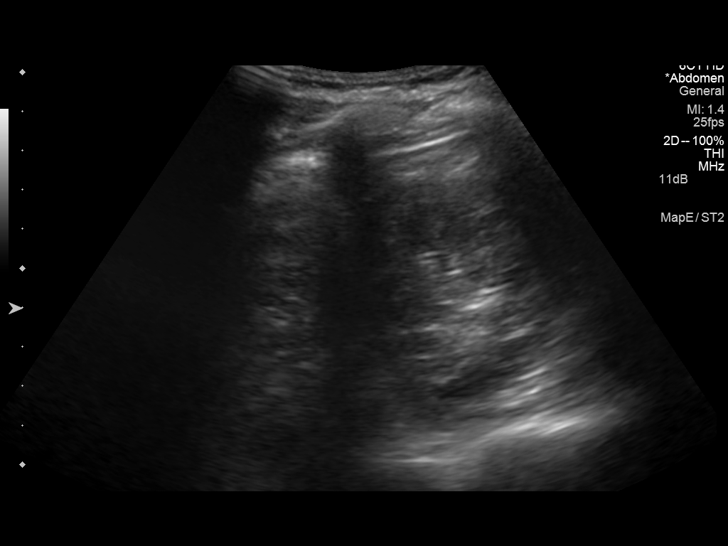
[im 27/27]
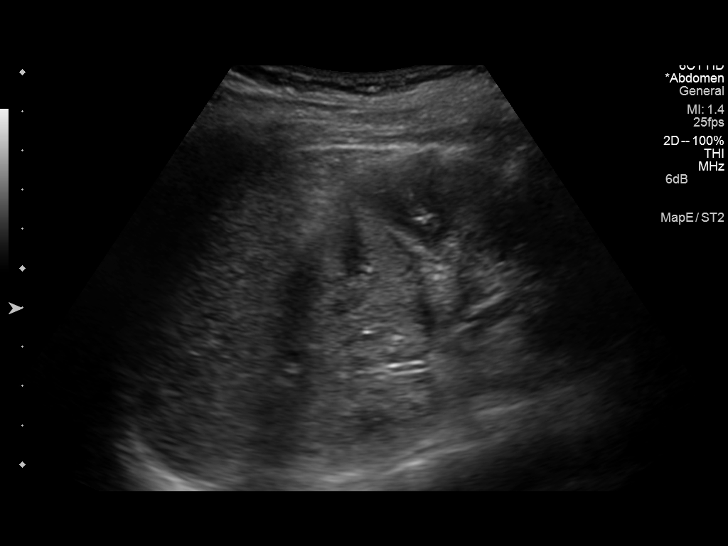

[14 of 25 positions shown; findings below may reference images not displayed]

FINDINGS: Right Kidney:

Length: 11.3 cm. Echogenicity within normal limits. No mass,
collection or hydronephrosis visualized.

Left Kidney:

Length: 11.7 cm. Echogenicity within normal limits. No mass,
collection or hydronephrosis visualized.

Bladder:

Appears normal for degree of distention. Normal bilateral ureteral
jets are present.
IMPRESSION: Normal renal ultrasound.

## 2020-01-02 ENCOUNTER — Emergency Department (HOSPITAL_COMMUNITY)
Admission: EM | Admit: 2020-01-02 | Discharge: 2020-01-03 | Discharge status: Against Medical Advice | Payer: Managed Care, Other (non HMO) | Attending: Emergency Medicine | Admitting: Emergency Medicine

## 2020-01-02 ENCOUNTER — Other Ambulatory Visit: Payer: Self-pay

## 2020-01-02 ENCOUNTER — Encounter (HOSPITAL_COMMUNITY): Payer: Self-pay | Admitting: *Deleted

## 2020-01-02 DIAGNOSIS — Z3A10 10 weeks gestation of pregnancy: Secondary | ICD-10-CM | POA: Diagnosis not present

## 2020-01-02 DIAGNOSIS — O021 Missed abortion: Secondary | ICD-10-CM | POA: Insufficient documentation

## 2020-01-02 DIAGNOSIS — O039 Complete or unspecified spontaneous abortion without complication: Secondary | ICD-10-CM

## 2020-01-02 DIAGNOSIS — D649 Anemia, unspecified: Secondary | ICD-10-CM | POA: Diagnosis not present

## 2020-01-02 DIAGNOSIS — F909 Attention-deficit hyperactivity disorder, unspecified type: Secondary | ICD-10-CM | POA: Diagnosis not present

## 2020-01-02 DIAGNOSIS — Z91040 Latex allergy status: Secondary | ICD-10-CM | POA: Insufficient documentation

## 2020-01-02 DIAGNOSIS — O4691 Antepartum hemorrhage, unspecified, first trimester: Secondary | ICD-10-CM | POA: Diagnosis present

## 2020-01-02 LAB — URINALYSIS, ROUTINE W REFLEX MICROSCOPIC
Bilirubin Urine: NEGATIVE
Glucose, UA: NEGATIVE mg/dL
Ketones, ur: NEGATIVE mg/dL
Nitrite: NEGATIVE
Protein, ur: NEGATIVE mg/dL
RBC / HPF: >50 RBC/hpf — ABNORMAL HIGH (ref 0–5)
Specific Gravity, Urine: 1.015 (ref 1.005–1.030)
pH: 7 (ref 5.0–8.0)

## 2020-01-02 LAB — CBC WITH DIFFERENTIAL/PLATELET
Abs Immature Granulocytes: 0.04 10*3/uL (ref 0.00–0.07)
Basophils Absolute: 0.1 10*3/uL (ref 0.0–0.1)
Basophils Relative: 1 %
Eosinophils Absolute: 0.3 10*3/uL (ref 0.0–0.5)
Eosinophils Relative: 3 %
HCT: 25.8 % — ABNORMAL LOW (ref 36.0–46.0)
Hemoglobin: 8 g/dL — ABNORMAL LOW (ref 12.0–15.0)
Immature Granulocytes: 1 %
Lymphocytes Relative: 28 %
Lymphs Abs: 2.4 10*3/uL (ref 0.7–4.0)
MCH: 29.4 pg (ref 26.0–34.0)
MCHC: 31 g/dL (ref 30.0–36.0)
MCV: 94.9 fL (ref 80.0–100.0)
Monocytes Absolute: 0.4 10*3/uL (ref 0.1–1.0)
Monocytes Relative: 5 %
Neutro Abs: 5.5 10*3/uL (ref 1.7–7.7)
Neutrophils Relative %: 62 %
Platelets: 331 10*3/uL (ref 150–400)
RBC: 2.72 MIL/uL — ABNORMAL LOW (ref 3.87–5.11)
RDW: 13.3 % (ref 11.5–15.5)
WBC: 8.7 10*3/uL (ref 4.0–10.5)
nRBC: 0 % (ref 0.0–0.2)

## 2020-01-02 LAB — COMPREHENSIVE METABOLIC PANEL
ALT: 18 U/L (ref 0–44)
AST: 17 U/L (ref 15–41)
Albumin: 3.7 g/dL (ref 3.5–5.0)
Alkaline Phosphatase: 42 U/L (ref 38–126)
Anion gap: 10 (ref 5–15)
BUN: 10 mg/dL (ref 6–20)
CO2: 20 mmol/L — ABNORMAL LOW (ref 22–32)
Calcium: 8.6 mg/dL — ABNORMAL LOW (ref 8.9–10.3)
Chloride: 106 mmol/L (ref 98–111)
Creatinine, Ser: 0.44 mg/dL (ref 0.44–1.00)
GFR calc Af Amer: >60 mL/min (ref >60)
GFR calc non Af Amer: >60 mL/min (ref >60)
Glucose, Bld: 87 mg/dL (ref 70–99)
Potassium: 3.5 mmol/L (ref 3.5–5.1)
Sodium: 136 mmol/L (ref 135–145)
Total Bilirubin: 0.5 mg/dL (ref 0.3–1.2)
Total Protein: 6.7 g/dL (ref 6.5–8.1)

## 2020-01-02 LAB — CBG MONITORING, ED: Glucose-Capillary: 94 mg/dL (ref 70–99)

## 2020-01-02 LAB — HCG, QUANTITATIVE, PREGNANCY: hCG, Beta Chain, Quant, S: 7280 m[IU]/mL — ABNORMAL HIGH (ref <5)

## 2020-01-02 NOTE — ED Triage Notes (Addendum)
Pt c/o vaginal bleeding that started Friday night, was seen by obgyn on Monday and diagnosed with miscarriage, pt continues to have vaginal bleeding, lower abd cramping. Was seen by ob gyn again today, pt also c/o lower extremity swelling for the past few days,

## 2020-01-03 NOTE — ED Provider Notes (Signed)
[facility] EMERGENCY DEPARTMENT Provider Note   CSN: [id number] Arrival date & time: 01/02/20  1738   Time seen 11:15 PM   History Chief Complaint  Patient presents with  . Vaginal Bleeding    [name] [name] is a 34 y.o. female.  HPI   Patient states she is G3, P1 Ab1 and she was approximately [redacted] weeks pregnant.  She states on August 18th she started having blood clots that she would pass with urinating.  She also describes bladder spasms.  She states she was seen the next day by her OB/GYN and was diagnosed with a urinary tract infection and had a culture that grew out E. coli.  She was prescribed Macrobid and she has 1 pill left.  She also was given Pyridium.  She started the Macrobid on the 20th.  She states she never had dysuria.  She states the evening of the 20th she started having a lot of cramping and the following evening on the 21st she started having vaginal bleeding with clots.  She was seen again by her OB/GYN on the 23rd and she states she had an ultrasound which showed a fetal sac and she does not know if there was a fetus seen.  She states there was no heartbeat.  She was started on Cytotec and was taking it every 6 hours.  She noted today she was having diffuse swelling of her extremities and arms and her abdomen.  She denies chest pain or shortness of breath.  She was seen again today by her OB/GYN and on pelvic exam she states her cervix was open 3 cm and they pulled out the placenta intact after which her August shrunk down to a centimeter and the bleeding improved.  She states she is still bleeding but not as bad.  There is a comment today in her paperwork that there was no fetal material seen on ultrasound today.  She had blood work done today which has not resulted.  She states she is to follow-up in 2 weeks.  She reports she started becoming anemic after the birth of her son last September.  She states her hemoglobin got down to 8 and she was given iron transfusions x2  and it brought her hemoglobin up to 10.  She also takes iron pills twice a day.  She states today she feels dizzy and lightheaded and she feels a little off balance when she walks.  She is still having some suprapubic pain and cramping she also was given 4 more Cytotec today to take which she has taken.  All this care was done in [facility].  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  PCP McAlexander, Jabier Gauss, FNP OB GYN Dr Mina Marble  Past Medical History:  Diagnosis Date  . ADHD (attention deficit hyperactivity disorder)   . Celiac disease   . Complication of anesthesia    pt aspirated when being put to sleep for tonsillectomy in past   . MRSA (methicillin resistant staph aureus) culture positive   . Pneumonia     Patient Active Problem List   Diagnosis Date Noted  . 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 Cellulitis and abscess 01/31/2014  . Abscess 01/21/2014  . Cellulitis 01/18/2014  . Sepsis (HCC) 01/18/2014    Past Surgical History:  Procedure Laterality Date  . INCISION AND DRAINAGE ABSCESS Left 01/21/2014   Procedure: INCISION AND DRAINAGE LEFT ELBOW ABSCESS;  Surgeon: Duffy Rhody  Elita Quick, MD;  Location: [facility];  Service: Orthopedics;  Laterality: Left;  . TONSILLECTOMY  2004     OB History   No obstetric history on file.     Family History  Problem Relation Age of Onset  . Stroke Mother   . Cancer - Other Father     Social History   Tobacco Use  . Smoking status: Never Smoker  . Smokeless tobacco: Never Used  Substance Use Topics  . Alcohol use: No  . Drug use: No    Home Medications Prior to Admission medications   Medication Sig Start Date End Date Taking? Authorizing Provider  albuterol (PROVENTIL HFA;VENTOLIN HFA) 108 (90 Base) MCG/ACT inhaler Inhale 2 puffs into the lungs every 4 (four) hours as needed for wheezing or shortness of breath. 03/21/16   Samuel Jester, DO  albuterol  (PROVENTIL) (2.5 MG/3ML) 0.083% nebulizer solution Take 3 mLs (2.5 mg total) by nebulization every 4 (four) hours as needed for wheezing or shortness of breath. 03/21/16   Samuel Jester, DO  amphetamine-dextroamphetamine (ADDERALL) 10 MG tablet Take 10 mg by mouth daily at 12 noon. In afternoon    [provider]  amphetamine-dextroamphetamine (ADDERALL) 30 MG tablet Take 30 mg by mouth daily. In AM    [provider]  azithromycin (ZITHROMAX) 250 MG tablet Take 1 tablet (250 mg total) by mouth daily. Patient not taking: Reported on 03/21/2016 02/28/16   McKeag, Janine Ores, MD  benzonatate (TESSALON) 100 MG capsule Take 1 capsule (100 mg total) by mouth 3 (three) times daily as needed for cough. 03/21/16   Samuel Jester, DO  cephALEXin (KEFLEX) 500 MG capsule Take 1 capsule (500 mg total) by mouth 2 (two) times daily. Patient not taking: Reported on 03/21/2016 02/28/16   McKeag, Janine Ores, MD  dexamethasone (DECADRON) 4 MG tablet Take 1 tablet (4 mg total) by mouth 2 (two) times daily with a meal. Patient not taking: Reported on 03/21/2016 02/23/16   Ivery Quale, PA-C  HYDROcodone-acetaminophen (NORCO/VICODIN) 5-325 MG tablet 1 or 2 tabs PO q6 hours prn pain 03/21/16   Samuel Jester, DO  ibuprofen (ADVIL,MOTRIN) 600 MG tablet Take 1 tablet (600 mg total) by mouth 4 (four) times daily. Patient not taking: Reported on 03/21/2016 02/23/16   Ivery Quale, PA-C  levofloxacin (LEVAQUIN) 750 MG tablet Take 1 tablet (750 mg total) by mouth daily. 03/21/16   Samuel Jester, DO  methocarbamol (ROBAXIN) 500 MG tablet Take 2 tablets (1,000 mg total) by mouth 4 (four) times daily as needed for muscle spasms (muscle spasm/pain). 03/21/16   Samuel Jester, DO  predniSONE (DELTASONE) 20 MG tablet Take 2 tablets (40 mg total) by mouth daily. 03/21/16   Samuel Jester, DO  promethazine-codeine (PHENERGAN WITH CODEINE) 6.25-10 MG/5ML syrup Take 10 mLs by mouth every 4 (four) hours as  needed. Patient not taking: Reported on 03/21/2016 02/23/16   Ivery Quale, PA-C    Allergies    Latex, Pineapple, Aloe vera, and Vancomycin  Review of Systems   Review of Systems  All other systems reviewed and are negative.   Physical Exam Updated Vital Signs BP 124/83 (BP Location: Right Arm)   Pulse 98   Temp 98.8 F (37.1 C) (Oral)   Resp 18   Ht 5\' 4"  (1.626 m)   Wt 69.4 kg   SpO2 100%   BMI 26.26 kg/m   Physical Exam Vitals and nursing note reviewed.  Constitutional:      General: She is not in acute  distress.    Appearance: Normal appearance. She is normal weight.  HENT:     Head: Normocephalic and atraumatic.     Nose: Nose normal.  Eyes:     Extraocular Movements: Extraocular movements intact.     Conjunctiva/sclera: Conjunctivae normal.     Pupils: Pupils are equal, round, and reactive to light.  Cardiovascular:     Rate and Rhythm: Normal rate and regular rhythm.     Pulses: Normal pulses.     Heart sounds: Normal heart sounds.  Pulmonary:     Effort: Pulmonary effort is normal. No respiratory distress.  Abdominal:     General: Bowel sounds are normal.     Palpations: Abdomen is soft.     Tenderness: There is abdominal tenderness.       Comments: Patient has some discomfort to palpation over the suprapubic and the left abdomen without guarding or rebound.  Musculoskeletal:        General: Normal range of motion.     Cervical back: Normal range of motion.  Skin:    General: Skin is warm and dry.  Neurological:     General: No focal deficit present.     Mental Status: She is alert and oriented to person, place, and time.     Cranial Nerves: No cranial nerve deficit.  Psychiatric:        Mood and Affect: Mood normal.        Behavior: Behavior normal.        Thought Content: Thought content normal.     ED Results / Procedures / Treatments   Labs (all labs ordered are listed, but only abnormal results are displayed) Results for orders  placed or performed during the hospital encounter of 01/02/20  CBC with Differential  Result Value Ref Range   WBC 8.7 4.0 - 10.5 K/uL   RBC 2.72 (L) 3.87 - 5.11 MIL/uL   Hemoglobin 8.0 (L) 12.0 - 15.0 g/dL   HCT 16.125.8 (L) 36 - 46 %   MCV 94.9 80.0 - 100.0 fL   MCH 29.4 26.0 - 34.0 pg   MCHC 31.0 30.0 - 36.0 g/dL   RDW 09.613.3 04.511.5 - 40.915.5 %   Platelets 331 150 - 400 K/uL   nRBC 0.0 0.0 - 0.2 %   Neutrophils Relative % 62 %   Neutro Abs 5.5 1.7 - 7.7 K/uL   Lymphocytes Relative 28 %   Lymphs Abs 2.4 0.7 - 4.0 K/uL   Monocytes Relative 5 %   Monocytes Absolute 0.4 0 - 1 K/uL   Eosinophils Relative 3 %   Eosinophils Absolute 0.3 0 - 0 K/uL   Basophils Relative 1 %   Basophils Absolute 0.1 0 - 0 K/uL   Immature Granulocytes 1 %   Abs Immature Granulocytes 0.04 0.00 - 0.07 K/uL  Comprehensive metabolic panel  Result Value Ref Range   Sodium 136 135 - 145 mmol/L   Potassium 3.5 3.5 - 5.1 mmol/L   Chloride 106 98 - 111 mmol/L   CO2 20 (L) 22 - 32 mmol/L   Glucose, Bld 87 70 - 99 mg/dL   BUN 10 6 - 20 mg/dL   Creatinine, Ser 8.110.44 0.44 - 1.00 mg/dL   Calcium 8.6 (L) 8.9 - 10.3 mg/dL   Total Protein 6.7 6.5 - 8.1 g/dL   Albumin 3.7 3.5 - 5.0 g/dL   AST 17 15 - 41 U/L   ALT 18 0 - 44 U/L   Alkaline Phosphatase 42 38 -  126 U/L   Total Bilirubin 0.5 0.3 - 1.2 mg/dL   GFR calc non Af Amer >60 >60 mL/min   GFR calc Af Amer >60 >60 mL/min   Anion gap 10 5 - 15  Urinalysis, Routine w reflex microscopic Urine, Clean Catch  Result Value Ref Range   Color, Urine YELLOW YELLOW   APPearance CLOUDY (A) CLEAR   Specific Gravity, Urine 1.015 1.005 - 1.030   pH 7.0 5.0 - 8.0   Glucose, UA NEGATIVE NEGATIVE mg/dL   Hgb urine dipstick LARGE (A) NEGATIVE   Bilirubin Urine NEGATIVE NEGATIVE   Ketones, ur NEGATIVE NEGATIVE mg/dL   Protein, ur NEGATIVE NEGATIVE mg/dL   Nitrite NEGATIVE NEGATIVE   Leukocytes,Ua TRACE (A) NEGATIVE   RBC / HPF >50 (H) 0 - 5 RBC/hpf   WBC, UA 11-20 0 - 5 WBC/hpf    Bacteria, UA RARE (A) NONE SEEN   Squamous Epithelial / LPF 6-10 0 - 5   Mucus PRESENT    Amorphous Crystal PRESENT   hCG, quantitative, pregnancy  Result Value Ref Range   hCG, Beta Chain, Quant, S 7,280 (H) <5 mIU/mL  CBG monitoring, ED  Result Value Ref Range   Glucose-Capillary 94 70 - 99 mg/dL   Laboratory interpretation all normal except anemia, contaminated urinalysis    EKG None  Radiology No results found.  Procedures Procedures (including critical care time)  Medications Ordered in ED Medications - No data to display  ED Course  I have reviewed the triage vital signs and the nursing notes.  Pertinent labs & imaging results that were available during my care of the patient were reviewed by me and considered in my medical decision making (see chart for details).    MDM Rules/Calculators/A&P                          Patient was fully clothed and walking out of the room when I entered to examine her.  We discussed getting an ultrasound just to make sure she did not have signs of ectopic pregnancy but she does not want an ultrasound to be done.  She states she saw the ultrasound on Monday and she solve the pregnancy that was intrauterine.  We discussed third be a small chance she could have both intrauterine and extrauterine pregnancy but she is not interested in pursuing ultrasound.  I was going to give her Toradol to see if that would help with her abdominal cramping and bleeding which she states is actually improved since her doctor removed the fetal tissue.  We discussed that her doctors could order her iron transfusions again, we normally do not do that emergently in the emergency department.  At this point she is young and healthy and probably does not need a blood transfusion at this time.  Patient is extremely concerned about having swelling but there is no pitting edema noted.  She states normally her ankles are very bony and I did not appreciate any  abnormality.  Nurse reports patient walked out of the room and left.    Final Clinical Impression(s) / ED Diagnoses Final diagnoses:  Miscarriage  Anemia, unspecified type    Rx / DC Orders  Pt left AMA  Devoria Albe, MD, Concha Pyo, MD 01/03/20 [phone]

## 2020-01-03 NOTE — ED Notes (Signed)
Pt left her stating that she would look at her result in my chart and she was not staying.

## 2023-01-10 ENCOUNTER — Emergency Department (HOSPITAL_COMMUNITY)
Admission: EM | Admit: 2023-01-10 | Discharge: 2023-01-10 | Disposition: A | Discharge status: Home / Self Care | Payer: Medicaid Other | Attending: Emergency Medicine | Admitting: Emergency Medicine

## 2023-01-10 ENCOUNTER — Encounter (HOSPITAL_COMMUNITY): Payer: Self-pay

## 2023-01-10 ENCOUNTER — Other Ambulatory Visit: Payer: Self-pay

## 2023-01-10 DIAGNOSIS — Z91040 Latex allergy status: Secondary | ICD-10-CM | POA: Diagnosis not present

## 2023-01-10 DIAGNOSIS — R112 Nausea with vomiting, unspecified: Secondary | ICD-10-CM | POA: Diagnosis not present

## 2023-01-10 DIAGNOSIS — L03317 Cellulitis of buttock: Secondary | ICD-10-CM | POA: Diagnosis not present

## 2023-01-10 DIAGNOSIS — R109 Unspecified abdominal pain: Secondary | ICD-10-CM | POA: Insufficient documentation

## 2023-01-10 DIAGNOSIS — L0231 Cutaneous abscess of buttock: Secondary | ICD-10-CM | POA: Insufficient documentation

## 2023-01-10 LAB — COMPREHENSIVE METABOLIC PANEL
ALT: 46 U/L — ABNORMAL HIGH (ref 0–44)
AST: 42 U/L — ABNORMAL HIGH (ref 15–41)
Albumin: 4 g/dL (ref 3.5–5.0)
Alkaline Phosphatase: 63 U/L (ref 38–126)
Anion gap: 10 (ref 5–15)
BUN: 12 mg/dL (ref 6–20)
CO2: 23 mmol/L (ref 22–32)
Calcium: 9.2 mg/dL (ref 8.9–10.3)
Chloride: 102 mmol/L (ref 98–111)
Creatinine, Ser: 0.56 mg/dL (ref 0.44–1.00)
GFR, Estimated: >60 mL/min (ref >60)
Glucose, Bld: 108 mg/dL — ABNORMAL HIGH (ref 70–99)
Potassium: 3.9 mmol/L (ref 3.5–5.1)
Sodium: 135 mmol/L (ref 135–145)
Total Bilirubin: 0.4 mg/dL (ref 0.3–1.2)
Total Protein: 7.5 g/dL (ref 6.5–8.1)

## 2023-01-10 LAB — CBC
HCT: 38.4 % (ref 36.0–46.0)
Hemoglobin: 12.4 g/dL (ref 12.0–15.0)
MCH: 30.9 pg (ref 26.0–34.0)
MCHC: 32.3 g/dL (ref 30.0–36.0)
MCV: 95.8 fL (ref 80.0–100.0)
Platelets: 330 10*3/uL (ref 150–400)
RBC: 4.01 MIL/uL (ref 3.87–5.11)
RDW: 13.4 % (ref 11.5–15.5)
WBC: 10.7 10*3/uL — ABNORMAL HIGH (ref 4.0–10.5)
nRBC: 0 % (ref 0.0–0.2)

## 2023-01-10 LAB — URINALYSIS, ROUTINE W REFLEX MICROSCOPIC
Bilirubin Urine: NEGATIVE
Glucose, UA: NEGATIVE mg/dL
Hgb urine dipstick: NEGATIVE
Ketones, ur: NEGATIVE mg/dL
Leukocytes,Ua: NEGATIVE
Nitrite: NEGATIVE
Protein, ur: NEGATIVE mg/dL
Specific Gravity, Urine: 1.013 (ref 1.005–1.030)
pH: 8 (ref 5.0–8.0)

## 2023-01-10 LAB — PREGNANCY, URINE: Preg Test, Ur: NEGATIVE

## 2023-01-10 LAB — LIPASE, BLOOD: Lipase: 21 U/L (ref 11–51)

## 2023-01-10 MED ORDER — POVIDONE-IODINE 10 % EX SOLN
CUTANEOUS | Status: DC | PRN
  Filled 2023-01-10: qty 14.8

## 2023-01-10 MED ORDER — ONDANSETRON HCL 4 MG PO TABS
4.0000 mg | ORAL_TABLET | Freq: Four times a day (QID) | ORAL | 0 refills | Status: AC
Start: 2023-01-10 — End: ?

## 2023-01-10 MED ORDER — HYDROMORPHONE HCL 1 MG/ML IJ SOLN
1.0000 mg | Freq: Once | INTRAMUSCULAR | Status: AC
  Administered 2023-01-10: 1 mg via INTRAVENOUS
  Filled 2023-01-10: qty 1

## 2023-01-10 MED ORDER — CLINDAMYCIN PHOSPHATE 600 MG/50ML IV SOLN
600.0000 mg | Freq: Once | INTRAVENOUS | Status: AC
  Administered 2023-01-10: 600 mg via INTRAVENOUS
  Filled 2023-01-10: qty 50

## 2023-01-10 MED ORDER — KETOROLAC TROMETHAMINE 30 MG/ML IJ SOLN
30.0000 mg | Freq: Once | INTRAMUSCULAR | Status: AC
  Administered 2023-01-10: 30 mg via INTRAVENOUS
  Filled 2023-01-10: qty 1

## 2023-01-10 MED ORDER — ONDANSETRON HCL 4 MG/2ML IJ SOLN
4.0000 mg | Freq: Once | INTRAMUSCULAR | Status: AC
  Administered 2023-01-10: 4 mg via INTRAVENOUS
  Filled 2023-01-10: qty 2

## 2023-01-10 MED ORDER — SODIUM CHLORIDE 0.9 % IV BOLUS
1000.0000 mL | Freq: Once | INTRAVENOUS | Status: AC
  Administered 2023-01-10: 1000 mL via INTRAVENOUS

## 2023-01-10 MED ORDER — CLINDAMYCIN HCL 300 MG PO CAPS: 300.0000 mg | ORAL_CAPSULE | Freq: Three times a day (TID) | ORAL | 0 refills | Status: AC

## 2023-01-10 MED ORDER — LIDOCAINE-EPINEPHRINE (PF) 2 %-1:200000 IJ SOLN
10.0000 mL | Freq: Once | INTRAMUSCULAR | Status: AC
  Administered 2023-01-10: 10 mL via INTRADERMAL
  Filled 2023-01-10: qty 20

## 2023-01-10 NOTE — Discharge Instructions (Addendum)
Please stop the doxycycline.  You may start the clindamycin tomorrow.  Frequent warm wet compresses or soaks to the affected area.  You may remove the packing in 2 to 3 days, return here for any worsening symptoms.

## 2023-01-10 NOTE — ED Notes (Signed)
Dressing applied Pain 9/10 Pt requests additional pain medications

## 2023-01-10 NOTE — ED Triage Notes (Signed)
Pt reports:  Insect bite Left buttock Received antibiotic on Friday Worsening site area Abdomen pain Vomiting

## 2023-01-10 NOTE — ED Notes (Signed)
Introduced self to pt and collected UA Pt stated that she started Doxy and [facility]AP#3 Friday for spider bite LEFT buttock red and inflamed Pt complains of ABD distention since starting doxy Pt stated that she is currently on her period  Call bell given  Family at bedside

## 2023-01-14 NOTE — ED Provider Notes (Signed)
[place] EMERGENCY DEPARTMENT AT [facility] Provider Note   CSN: [id number] Arrival date & time: 01/10/23  1322     History  Chief Complaint  Patient presents with   Abdominal Pain   Emesis   Insect Bite    [name] [name] is a 37 y.o. female.   Abdominal Pain Associated symptoms: nausea and vomiting   Associated symptoms: no chest pain, no chills, no dysuria, no fever and no shortness of breath   Emesis Associated symptoms: abdominal pain   Associated symptoms: no chills and no fever         [name] [name] is a 37 y.o. female who presents to the Emergency Department complaining of nausea vomiting possible insect bite left buttock area.  Was seen at another facility 3 days ago given antibiotic without relief.  She believes the antibiotic is making her sick on her stomach as she did not have abdominal pain or vomiting prior to starting the antibiotic.  History of abscess in the past with cellulitis.  Denies any fever.   Home Medications Prior to Admission medications   Medication Sig Start Date End Date Taking? Authorizing Provider  acetaminophen-codeine (TYLENOL #3) 300-30 MG tablet Take 1 tablet by mouth every 6 (six) hours as needed. 01/07/23  Yes [provider]  amphetamine-dextroamphetamine (ADDERALL XR) 30 MG 24 hr capsule Take 1 capsule by mouth every morning. 11/29/21  Yes [provider]  famotidine (PEPCID) 20 MG tablet Take 1 tablet by mouth every 12 (twelve) hours. 01/06/20  Yes [provider]  fluconazole (DIFLUCAN) 150 MG tablet Take 150 mg by mouth once. 01/07/23  Yes [provider]  albuterol (PROVENTIL HFA;VENTOLIN HFA) 108 (90 Base) MCG/ACT inhaler Inhale 2 puffs into the lungs every 4 (four) hours as needed for wheezing or shortness of breath. 03/21/16   Samuel Jester, DO  albuterol (PROVENTIL) (2.5 MG/3ML) 0.083% nebulizer solution Take 3 mLs (2.5 mg total) by nebulization every 4 (four) hours as needed for  wheezing or shortness of breath. 03/21/16   Samuel Jester, DO  amphetamine-dextroamphetamine (ADDERALL) 10 MG tablet Take 10 mg by mouth daily at 12 noon. In afternoon    [provider]  amphetamine-dextroamphetamine (ADDERALL) 30 MG tablet Take 30 mg by mouth daily. In AM    [provider]  benzonatate (TESSALON) 100 MG capsule Take 1 capsule (100 mg total) by mouth 3 (three) times daily as needed for cough. 03/21/16   Samuel Jester, DO  cariprazine (VRAYLAR) 1.5 MG capsule Take 1.5 mg by mouth daily. 07/07/22   [provider]  chlorhexidine (PERIDEX) 0.12 % solution Use as directed 15 mLs in the mouth or throat 2 (two) times daily. 07/26/21   [provider]  clindamycin (CLEOCIN) 300 MG capsule Take 1 capsule (300 mg total) by mouth 3 (three) times daily. 01/10/23  Yes Yanira Tolsma, PA-C  clonazePAM (KLONOPIN) 0.5 MG tablet Take 0.5 mg by mouth at bedtime. 12/26/19   [provider]  dexamethasone (DECADRON) 4 MG tablet Take 1 tablet (4 mg total) by mouth 2 (two) times daily with a meal. Patient not taking: Reported on 03/21/2016 02/23/16   Ivery Quale, PA-C  diphenhydrAMINE (BENADRYL ALLERGY) 25 MG tablet Take 25 mg by mouth at bedtime as needed for itching, allergies or sleep.    [provider]  EPINEPHrine 0.3 mg/0.3 mL IJ SOAJ injection Inject 0.3 mg into the muscle as needed for anaphylaxis. 12/26/19   [provider]  famotidine (PEPCID) 40 MG  tablet Take 40 mg by mouth at bedtime.    [provider]  fluticasone (FLONASE) 50 MCG/ACT nasal spray Place 1 spray into both nostrils daily.    [provider]  HYDROcodone-acetaminophen (NORCO/VICODIN) 5-325 MG tablet 1 or 2 tabs PO q6 hours prn pain 03/21/16   Samuel Jester, DO  ibuprofen (ADVIL) 800 MG tablet Take 800 mg by mouth every 8 (eight) hours as needed (pain). Take with food. 12/27/19   [provider]  ibuprofen (ADVIL,MOTRIN) 600 MG  tablet Take 1 tablet (600 mg total) by mouth 4 (four) times daily. Patient not taking: Reported on 03/21/2016 02/23/16   Ivery Quale, PA-C  Iron, Ferrous Sulfate, 325 (65 Fe) MG TABS Take 1 tablet by mouth 2 (two) times daily. 02/22/20   [provider]  levocetirizine (XYZAL) 5 MG tablet Take 5 mg by mouth every evening.    [provider]  magnesium oxide (MAG-OX) 400 MG tablet Take 400 mg by mouth daily. 01/13/20   [provider]  meloxicam (MOBIC) 15 MG tablet Take 1 tablet by mouth daily.    [provider]  methocarbamol (ROBAXIN) 500 MG tablet Take 2 tablets (1,000 mg total) by mouth 4 (four) times daily as needed for muscle spasms (muscle spasm/pain). 03/21/16   Samuel Jester, DO  naproxen (NAPROSYN) 500 MG tablet Take 500 mg by mouth 2 (two) times daily with a meal. 07/26/21   [provider]  norethindrone-ethinyl estradiol-FE (LOESTRIN FE 1/20) 1-20 MG-MCG tablet Take 1 tablet by mouth daily. 02/22/20   [provider]  ondansetron (ZOFRAN) 4 MG tablet Take 1 tablet (4 mg total) by mouth every 6 (six) hours. As needed for nausea vomiting 01/10/23  Yes Rudolph Daoust, PA-C  pantoprazole (PROTONIX) 20 MG tablet Take 20 mg by mouth daily.    [provider]  pantoprazole (PROTONIX) 40 MG tablet Take 40 mg by mouth daily.    [provider]  predniSONE (DELTASONE) 20 MG tablet Take 2 tablets (40 mg total) by mouth daily. 03/21/16   Samuel Jester, DO  promethazine (PHENERGAN) 25 MG tablet Take 1 tablet by mouth every 6 (six) hours as needed.    [provider]  promethazine-codeine (PHENERGAN WITH CODEINE) 6.25-10 MG/5ML syrup Take 10 mLs by mouth every 4 (four) hours as needed. Patient not taking: Reported on 03/21/2016 02/23/16   Ivery Quale, PA-C  propranolol ER (INDERAL LA) 60 MG 24 hr capsule Take 60 mg by mouth daily.    [provider]  sertraline (ZOLOFT) 50 MG tablet Take 50 mg by  mouth daily.    [provider]  valACYclovir (VALTREX) 1000 MG tablet Take 500 mg by mouth 2 (two) times daily.    [provider]      Allergies    Latex, Pineapple, Aloe vera, and Vancomycin    Review of Systems   Review of Systems  Constitutional:  Negative for chills and fever.  Respiratory:  Negative for shortness of breath.   Cardiovascular:  Negative for chest pain.  Gastrointestinal:  Positive for abdominal pain, nausea and vomiting.  Genitourinary:  Negative for dysuria and flank pain.  Skin:  Positive for color change.       Insect bite left buttock  Neurological:  Negative for dizziness, weakness and light-headedness.    Physical Exam Updated Vital Signs BP 110/68 (BP Location: Left Arm)   Pulse 84   Temp 98.1 F (36.7 C) (Oral)   Resp 16   Ht 5'  4" (1.626 m)   Wt 62.6 kg   SpO2 96%   BMI 23.69 kg/m  Physical Exam Vitals and nursing note reviewed.  Constitutional:      General: She is not in acute distress.    Appearance: She is well-developed. She is not toxic-appearing.  Cardiovascular:     Rate and Rhythm: Normal rate and regular rhythm.     Pulses: Normal pulses.  Pulmonary:     Effort: Pulmonary effort is normal.  Abdominal:     Palpations: Abdomen is soft.     Tenderness: There is no abdominal tenderness.  Musculoskeletal:        General: Normal range of motion.  Skin:    General: Skin is warm.     Capillary Refill: Capillary refill takes less than 2 seconds.     Findings: Erythema present.     Comments: 5 cm area of induration with central fluctuance and surrounding erythema mid left buttock.  No drainage.  No lymphangitis.  Neurological:     General: No focal deficit present.     Mental Status: She is alert.     Motor: No weakness.     ED Results / Procedures / Treatments   Labs (all labs ordered are listed, but only abnormal results are displayed) Labs Reviewed  COMPREHENSIVE METABOLIC PANEL - Abnormal; Notable for  the following components:      Result Value   Glucose, Bld 108 (*)    AST 42 (*)    ALT 46 (*)    All other components within normal limits  CBC - Abnormal; Notable for the following components:   WBC 10.7 (*)    All other components within normal limits  LIPASE, BLOOD  URINALYSIS, ROUTINE W REFLEX MICROSCOPIC  PREGNANCY, URINE    EKG None  Radiology No results found.  Procedures Procedures     INCISION AND DRAINAGE Performed by: Thanh Pomerleau Consent: Verbal consent obtained. Risks and benefits: risks, benefits and alternatives were discussed Type: abscess  Body area: left buttock  Anesthesia: local infiltration  Incision was made with a #11 scalpel.  Local anesthetic: lidocaine 2% with epinephrine  Anesthetic total: 4 ml  Complexity: complex Blunt dissection to break up loculations  Drainage: purulent  Drainage amount: large  Packing material: 1/2 in iodoform gauze  Patient tolerance: Patient tolerated the procedure well with no immediate complications.   Medications Ordered in ED Medications  sodium chloride 0.9 % bolus 1,000 mL (0 mLs Intravenous Stopped 01/10/23 1805)  HYDROmorphone (DILAUDID) injection 1 mg (1 mg Intravenous Given 01/10/23 1717)  ondansetron (ZOFRAN) injection 4 mg (4 mg Intravenous Given 01/10/23 1717)  lidocaine-EPINEPHrine (XYLOCAINE W/EPI) 2 %-1:200000 (PF) injection 10 mL (10 mLs Intradermal Given by Other 01/10/23 1805)  clindamycin (CLEOCIN) IVPB 600 mg (0 mg Intravenous Stopped 01/10/23 1935)  ketorolac (TORADOL) 30 MG/ML injection 30 mg (30 mg Intravenous Given 01/10/23 1852)    ED Course/ Medical Decision Making/ A&P                                 Medical Decision Making Patient here with complaint of nausea vomiting and abdominal pain after beginning antibiotic for insect bite to the left buttock.  History of cellulitis with abscess.  No history of diabetes.  She denies any fever or drainage from the site.  I suspect her  symptoms are secondary to abscess of left buttock.  She is nontoxic-appearing.  No  concerning symptoms for cellulitis.  Her abdomen is soft and nontender on exam.  Abdominal pain and nausea vomiting felt to be secondary to doxycycline that she has been taking for several days.  She did not have any abdominal pain or vomiting prior to starting doxycycline.  Amount and/or Complexity of Data Reviewed Labs: ordered.    Details: Labs overall reassuring Discussion of management or test interpretation with external provider(s): Patient given pain medication, IV antibiotics here.  No concerning symptoms for sepsis.  Successful I&D of the abscess.  Will start on clindamycin and discontinue the doxycycline.  She is agreeable to treatment plan with warm wet compresses or soaks and packing removal in 2 to 3 days.  Risk Prescription drug management.           Final Clinical Impression(s) / ED Diagnoses Final diagnoses:  Cellulitis and abscess of buttock    Rx / DC Orders ED Discharge Orders          Ordered    clindamycin (CLEOCIN) 300 MG capsule  3 times daily        01/10/23 1918    ondansetron (ZOFRAN) 4 MG tablet  Every 6 hours        01/10/23 1918              Pauline Aus, PA-C 01/14/23 1627    Vanetta Mulders, MD 01/25/23 [phone]

## 2024-06-07 ENCOUNTER — Other Ambulatory Visit: Payer: Self-pay

## 2024-06-07 ENCOUNTER — Encounter: Payer: Self-pay | Admitting: Physician Assistant

## 2024-06-07 ENCOUNTER — Ambulatory Visit: Payer: Self-pay | Admitting: Physician Assistant

## 2024-06-07 DIAGNOSIS — M2550 Pain in unspecified joint: Secondary | ICD-10-CM

## 2024-06-07 DIAGNOSIS — M25561 Pain in right knee: Secondary | ICD-10-CM

## 2024-06-07 DIAGNOSIS — M25562 Pain in left knee: Secondary | ICD-10-CM

## 2024-06-07 MED ORDER — METHYLPREDNISOLONE 4 MG PO TBPK: ORAL_TABLET | ORAL | 0 refills | Status: AC

## 2024-06-07 NOTE — ED Notes (Signed)
 Pt seen leaving ED with family member. Called patient at phone number on chart to ask about continuing care. Family member answers the phone. When asked to speak with patient family member hangs up the phone. RN called back and was told by family member she's in pain and I'm taking her to another hospital. Asked family member if patient removed IV. Pt heard in back ground that she removed IV and placed in sharps container

## 2024-06-07 NOTE — Addendum Note (Signed)
 Addended by: JACKSON LEADER on: 06/07/2024 12:44 PM   Modules accepted: Orders

## 2024-06-07 NOTE — Progress Notes (Signed)
 "  Office Visit Note   Patient: [name] [name]           Date of Birth: June 16, 1985           MRN: [id number] Visit Date: 06/07/2024              Requested by: McAlexander, Clint SQUIBB, FNP [address] [place],  [place] [address] PCP: Harlie Clint SQUIBB, FNP   Assessment & Plan: Visit Diagnoses:  1. Pain in both knees, unspecified chronicity   2. 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  Comes in today with chronic history of bilateral knee pain.  Denies any injuries.  Also has pain and swelling at times in her hands and has pain in her back.  No family history of inflammatory arthropathies.  She also has some mechanical symptoms in her left knee including painful popping clicking and catching.  She said often has flareup with her knees where they get red and swollen very difficult to move after sitting down.  She does take ibuprofen  has taken meloxicam in the past without any improvement.  Will draw a set of inflammatory labs today.  She does have some mechanical symptoms in her left knee.  Will place her on a Medrol  Dosepak she knows not to take other anti-inflammatories with that we will see if this overall helps all of her polyarthralgia.  Would probably indicate a referral to rheumatology but will look at the labs first.  If the steroid does not work we could consider just for relief injection into her knees.  Follow-Up Instructions: Return if symptoms worsen or fail to improve.   Orders:  Orders Placed This Encounter  Procedures   XR Knee 1-2 Views Left   XR Knee 1-2 Views Right   Meds ordered this encounter  Medications   methylPREDNISolone  (MEDROL  DOSEPAK) 4 MG TBPK tablet    Sig: Take as directed with food    Dispense:  21 tablet    Refill:  0      Procedures: No procedures performed   Clinical Data: No additional findings.   Subjective: No chief complaint on file.   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  She has had a chronic history of bilateral left greater than right knee pain.  No particular injuries.  She also reports some polyarthralgia and other joints in her body such as her hands and her back.  Denies any fever or chills.  Otherwise her health has been stable.  Review of Systems  All other systems reviewed and are negative.    Objective: Vital Signs: There were no vitals taken for this visit.  Physical Exam Constitutional:      Appearance: Normal appearance.  Pulmonary:     Effort: Pulmonary effort is normal.  Neurological:     General: No focal deficit present.     Mental Status: She is alert.     Ortho Exam Examination bilateral knees: No effusion no erythema she does have mild redness but no warmth.  Compartments are soft nontender neurovascular intact.  She has tenderness just below the patella on the left greater than the right on the medial and lateral joint lines.  Good endpoint on anterior draw. Specialty Comments:  No specialty comments available.  Imaging: XR Knee 1-2 Views Left Result Date: 06/07/2024 Radiographs of the left  knee do not demonstrate any acute changes will preserved joint spaces  XR Knee 1-2 Views Right Result Date: 06/07/2024 Radiograph of the right knee do not demonstrate any acute changes overall well-preserved joint spacing    PMFS History: Patient Active Problem List   Diagnosis Date Noted   Bilateral knee pain 06/07/2024   Polyarthralgia 06/07/2024   Cubital tunnel syndrome on left 02/28/2014   Cellulitis and abscess 01/31/2014   Abscess 01/21/2014   Cellulitis 01/18/2014   Sepsis (HCC) 01/18/2014   Past Medical History:  Diagnosis Date   ADHD (attention deficit hyperactivity disorder)    Celiac disease    Complication of anesthesia    pt aspirated when being put to sleep for tonsillectomy in past    MRSA (methicillin  resistant staph aureus) culture positive    Pneumonia     Family History  Problem Relation Age of Onset   Stroke Mother    Cancer - Other Father     Past Surgical History:  Procedure Laterality Date   INCISION AND DRAINAGE ABSCESS Left 01/21/2014   Procedure: INCISION AND DRAINAGE LEFT ELBOW ABSCESS;  Surgeon: Taft FORBES Minerva, MD;  Location: [facility];  Service: Orthopedics;  Laterality: Left;   TONSILLECTOMY  2004   Social History   Occupational History   Not on file  Tobacco Use   Smoking status: Never   Smokeless tobacco: Never  Substance and Sexual Activity   Alcohol use: No   Drug use: No   Sexual activity: Yes    Birth control/protection: Condom        "

## 2024-06-07 NOTE — ED Triage Notes (Signed)
 Right sided flank pain radiating to the abdomen since last pm.Blood in urine per patient.Nasuated.

## 2024-06-08 ENCOUNTER — Other Ambulatory Visit: Payer: Self-pay

## 2024-06-08 ENCOUNTER — Emergency Department (HOSPITAL_COMMUNITY)

## 2024-06-08 ENCOUNTER — Emergency Department (HOSPITAL_COMMUNITY)
Admission: EM | Admit: 2024-06-08 | Discharge: 2024-06-08 | Disposition: A | Discharge status: Home / Self Care | Attending: Emergency Medicine | Admitting: Emergency Medicine

## 2024-06-08 DIAGNOSIS — N3001 Acute cystitis with hematuria: Secondary | ICD-10-CM | POA: Diagnosis not present

## 2024-06-08 DIAGNOSIS — R10A3 Flank pain, bilateral: Secondary | ICD-10-CM | POA: Diagnosis present

## 2024-06-08 DIAGNOSIS — Z91040 Latex allergy status: Secondary | ICD-10-CM | POA: Insufficient documentation

## 2024-06-08 LAB — URINALYSIS, W/ REFLEX TO CULTURE (INFECTION SUSPECTED)
Bilirubin Urine: NEGATIVE
Glucose, UA: NEGATIVE mg/dL
Ketones, ur: 5 mg/dL — AB
Nitrite: POSITIVE — AB
Protein, ur: 100 mg/dL — AB
Specific Gravity, Urine: 1.012 (ref 1.005–1.030)
WBC, UA: >50 WBC/hpf (ref 0–5)
pH: 6 (ref 5.0–8.0)

## 2024-06-08 LAB — COMPREHENSIVE METABOLIC PANEL WITH GFR
ALT: 17 U/L (ref 0–44)
AST: 25 U/L (ref 15–41)
Albumin: 4.6 g/dL (ref 3.5–5.0)
Alkaline Phosphatase: 77 U/L (ref 38–126)
Anion gap: 17 — ABNORMAL HIGH (ref 5–15)
BUN: 13 mg/dL (ref 6–20)
CO2: 23 mmol/L (ref 22–32)
Calcium: 9.8 mg/dL (ref 8.9–10.3)
Chloride: 98 mmol/L (ref 98–111)
Creatinine, Ser: 0.46 mg/dL (ref 0.44–1.00)
GFR, Estimated: >60 mL/min
Glucose, Bld: 89 mg/dL (ref 70–99)
Potassium: 3.9 mmol/L (ref 3.5–5.1)
Sodium: 137 mmol/L (ref 135–145)
Total Bilirubin: 0.5 mg/dL (ref 0.0–1.2)
Total Protein: 7.7 g/dL (ref 6.5–8.1)

## 2024-06-08 LAB — CBC
HCT: 43 % (ref 36.0–46.0)
Hemoglobin: 13.8 g/dL (ref 12.0–15.0)
MCH: 30.3 pg (ref 26.0–34.0)
MCHC: 32.1 g/dL (ref 30.0–36.0)
MCV: 94.3 fL (ref 80.0–100.0)
Platelets: 318 10*3/uL (ref 150–400)
RBC: 4.56 MIL/uL (ref 3.87–5.11)
RDW: 12.8 % (ref 11.5–15.5)
WBC: 17.1 10*3/uL — ABNORMAL HIGH (ref 4.0–10.5)
nRBC: 0 % (ref 0.0–0.2)

## 2024-06-08 LAB — HCG, SERUM, QUALITATIVE: Preg, Serum: NEGATIVE

## 2024-06-08 MED ORDER — MORPHINE SULFATE (PF) 4 MG/ML IV SOLN
4.0000 mg | Freq: Once | INTRAVENOUS | Status: AC
  Administered 2024-06-08: 4 mg via INTRAVENOUS
  Filled 2024-06-08: qty 1

## 2024-06-08 MED ORDER — CEPHALEXIN 500 MG PO CAPS: 500.0000 mg | ORAL_CAPSULE | Freq: Four times a day (QID) | ORAL | 0 refills | Status: AC

## 2024-06-08 MED ORDER — IOHEXOL 300 MG/ML  SOLN
100.0000 mL | Freq: Once | INTRAMUSCULAR | Status: AC | PRN
  Administered 2024-06-08: 100 mL via INTRAVENOUS

## 2024-06-08 MED ORDER — KETOROLAC TROMETHAMINE 15 MG/ML IJ SOLN
15.0000 mg | Freq: Once | INTRAMUSCULAR | Status: AC
  Administered 2024-06-08: 15 mg via INTRAVENOUS
  Filled 2024-06-08: qty 1

## 2024-06-08 MED ORDER — ONDANSETRON HCL 4 MG PO TABS: 4.0000 mg | ORAL_TABLET | Freq: Four times a day (QID) | ORAL | 0 refills | Status: AC

## 2024-06-08 MED ORDER — ONDANSETRON HCL 4 MG/2ML IJ SOLN
4.0000 mg | Freq: Once | INTRAMUSCULAR | Status: AC
  Administered 2024-06-08: 4 mg via INTRAVENOUS
  Filled 2024-06-08: qty 2

## 2024-06-08 MED ORDER — SODIUM CHLORIDE 0.9 % IV SOLN
1.0000 g | Freq: Once | INTRAVENOUS | Status: AC
  Administered 2024-06-08: 1 g via INTRAVENOUS
  Filled 2024-06-08: qty 10

## 2024-06-08 MED ORDER — KETOROLAC TROMETHAMINE 10 MG PO TABS: 10.0000 mg | ORAL_TABLET | Freq: Four times a day (QID) | ORAL | 0 refills | Status: AC | PRN

## 2024-06-08 NOTE — Discharge Instructions (Addendum)
 You were diagnosed with a UTI in the emergency department today.  We have started you on IV antibiotics in the ED and will send you home with oral antibiotics.  Please take the full course.  Monitor for any concerning new or worsening symptoms.  Example of symptoms would be worsening pain, fainting, high fevers, or chest pain/shortness of breath please return the emergency department for further evaluation.  Your urine has been sent off for culture if any antibiotic changes need to occur you will be notified.  Please follow-up with a primary care in the next week for reevaluation of resolution of symptoms.

## 2024-06-08 NOTE — ED Triage Notes (Signed)
 Pt comes in for right flank pain and pelvic pain, painful urination, nausea, blood urine since 2 days ago.   Pt went to [facility] but left due to the long wait. Pt states she only received dilaudid  but no abx or IV fluids.   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

## 2024-06-11 LAB — URINE CULTURE: Culture: >=100000 — AB

## 2024-06-12 ENCOUNTER — Telehealth (HOSPITAL_BASED_OUTPATIENT_CLINIC_OR_DEPARTMENT_OTHER): Payer: Self-pay | Admitting: *Deleted

## 2024-06-12 LAB — ANA: Anti Nuclear Antibody (ANA): POSITIVE — AB

## 2024-06-12 LAB — VITAMIN D 25 HYDROXY (VIT D DEFICIENCY, FRACTURES): Vit D, 25-Hydroxy: 28 ng/mL — ABNORMAL LOW (ref 30–100)

## 2024-06-12 LAB — ANTI-NUCLEAR AB-TITER (ANA TITER): ANA Titer 1: 1:80 {titer} — ABNORMAL HIGH

## 2024-06-12 LAB — URIC ACID: Uric Acid, Serum: 2.9 mg/dL (ref 2.5–7.0)

## 2024-06-12 LAB — RHEUMATOID FACTOR: Rheumatoid fact SerPl-aCnc: <10 [IU]/mL

## 2024-06-12 LAB — SEDIMENTATION RATE: Sed Rate: 11 mm/h (ref 0–20)

## 2024-06-13 ENCOUNTER — Other Ambulatory Visit: Payer: Self-pay | Admitting: Physician Assistant

## 2024-06-13 DIAGNOSIS — M2550 Pain in unspecified joint: Secondary | ICD-10-CM

## 2024-08-30 ENCOUNTER — Ambulatory Visit: Admitting: Internal Medicine
